# Patient Record
Sex: Female | Born: 1965
Health system: Southern US, Community
[De-identification: ages and names within clinical notes are randomized; demographics above are authoritative.]

## PROBLEM LIST (undated history)

## (undated) DIAGNOSIS — N611 Abscess of the breast and nipple: Secondary | ICD-10-CM

## (undated) DIAGNOSIS — F419 Anxiety disorder, unspecified: Secondary | ICD-10-CM

## (undated) DIAGNOSIS — M1990 Unspecified osteoarthritis, unspecified site: Secondary | ICD-10-CM

## (undated) DIAGNOSIS — E785 Hyperlipidemia, unspecified: Secondary | ICD-10-CM

## (undated) DIAGNOSIS — I10 Essential (primary) hypertension: Secondary | ICD-10-CM

## (undated) HISTORY — PX: TUBAL LIGATION: SHX77

## (undated) HISTORY — PX: OTHER SURGICAL HISTORY: SHX169

## (undated) HISTORY — PX: BREAST EXCISIONAL BIOPSY: SUR124

---

## 2003-08-26 ENCOUNTER — Encounter (INDEPENDENT_AMBULATORY_CARE_PROVIDER_SITE_OTHER): Payer: Self-pay | Admitting: Nurse Practitioner

## 2003-08-26 LAB — CONVERTED CEMR LAB

## 2005-07-23 ENCOUNTER — Emergency Department (HOSPITAL_COMMUNITY): Admission: EM | Admit: 2005-07-23 | Discharge: 2005-07-23 | Payer: Self-pay | Admitting: Emergency Medicine

## 2005-07-26 ENCOUNTER — Emergency Department (HOSPITAL_COMMUNITY): Admission: EM | Admit: 2005-07-26 | Discharge: 2005-07-26 | Payer: Self-pay | Admitting: Emergency Medicine

## 2006-05-20 ENCOUNTER — Emergency Department (HOSPITAL_COMMUNITY): Admission: EM | Admit: 2006-05-20 | Discharge: 2006-05-20 | Payer: Self-pay | Admitting: Emergency Medicine

## 2006-08-13 ENCOUNTER — Emergency Department (HOSPITAL_COMMUNITY): Admission: EM | Admit: 2006-08-13 | Discharge: 2006-08-13 | Payer: Self-pay | Admitting: Family Medicine

## 2006-09-06 ENCOUNTER — Emergency Department (HOSPITAL_COMMUNITY): Admission: EM | Admit: 2006-09-06 | Discharge: 2006-09-06 | Payer: Self-pay | Admitting: Emergency Medicine

## 2006-09-28 ENCOUNTER — Ambulatory Visit: Payer: Self-pay | Admitting: Internal Medicine

## 2007-01-10 ENCOUNTER — Emergency Department (HOSPITAL_COMMUNITY): Admission: EM | Admit: 2007-01-10 | Discharge: 2007-01-10 | Payer: Self-pay | Admitting: Family Medicine

## 2007-04-14 ENCOUNTER — Ambulatory Visit: Payer: Self-pay | Admitting: Internal Medicine

## 2007-04-15 ENCOUNTER — Encounter: Payer: Self-pay | Admitting: Nurse Practitioner

## 2007-04-15 ENCOUNTER — Ambulatory Visit: Payer: Self-pay | Admitting: *Deleted

## 2007-04-15 DIAGNOSIS — E7800 Pure hypercholesterolemia, unspecified: Secondary | ICD-10-CM

## 2007-04-15 DIAGNOSIS — I10 Essential (primary) hypertension: Secondary | ICD-10-CM | POA: Insufficient documentation

## 2007-04-15 DIAGNOSIS — G43909 Migraine, unspecified, not intractable, without status migrainosus: Secondary | ICD-10-CM | POA: Insufficient documentation

## 2007-04-16 ENCOUNTER — Encounter (INDEPENDENT_AMBULATORY_CARE_PROVIDER_SITE_OTHER): Payer: Self-pay | Admitting: Internal Medicine

## 2007-05-12 ENCOUNTER — Encounter (INDEPENDENT_AMBULATORY_CARE_PROVIDER_SITE_OTHER): Payer: Self-pay | Admitting: *Deleted

## 2007-07-01 LAB — CONVERTED CEMR LAB: OCCULT 1: NEGATIVE

## 2007-07-02 ENCOUNTER — Ambulatory Visit: Payer: Self-pay | Admitting: Nurse Practitioner

## 2007-07-02 DIAGNOSIS — F341 Dysthymic disorder: Secondary | ICD-10-CM

## 2007-07-02 LAB — CONVERTED CEMR LAB
KOH Prep: NEGATIVE
Pap Smear: NORMAL

## 2007-07-05 ENCOUNTER — Encounter (INDEPENDENT_AMBULATORY_CARE_PROVIDER_SITE_OTHER): Payer: Self-pay | Admitting: Nurse Practitioner

## 2007-07-05 LAB — CONVERTED CEMR LAB
ALT: 18 U/L
AST: 16 U/L
Albumin: 4.7 g/dL
Alkaline Phosphatase: 90 U/L
BUN: 12 mg/dL
Basophils Absolute: 0 10*3/uL
Basophils Relative: 0 %
CO2: 28 meq/L
Calcium: 10 mg/dL
Chlamydia, DNA Probe: NEGATIVE
Chloride: 96 meq/L
Cholesterol: 225 mg/dL — ABNORMAL HIGH
Creatinine, Ser: 0.78 mg/dL
Eosinophils Absolute: 0.2 10*3/uL
Eosinophils Relative: 2 %
GC Probe Amp, Genital: NEGATIVE
Glucose, Bld: 85 mg/dL
HCT: 47.5 % — ABNORMAL HIGH
HDL: 51 mg/dL
Hemoglobin: 16.3 g/dL — ABNORMAL HIGH
LDL Cholesterol: 146 mg/dL — ABNORMAL HIGH
Lymphocytes Relative: 24 %
Lymphs Abs: 2.1 10*3/uL
MCHC: 34.3 g/dL
MCV: 95 fL
Monocytes Absolute: 0.5 10*3/uL
Monocytes Relative: 6 %
Neutro Abs: 5.9 10*3/uL
Neutrophils Relative %: 68 %
Platelets: 326 10*3/uL
Potassium: 3.8 meq/L
RBC: 5 M/uL
RDW: 13.6 %
Sodium: 140 meq/L
TSH: 1.211 u[IU]/mL
Total Bilirubin: 0.5 mg/dL
Total CHOL/HDL Ratio: 4.4
Total Protein: 7.5 g/dL
Triglycerides: 140 mg/dL
VLDL: 28 mg/dL
WBC: 8.7 10*3/uL

## 2007-10-12 ENCOUNTER — Emergency Department (HOSPITAL_COMMUNITY): Admission: EM | Admit: 2007-10-12 | Discharge: 2007-10-12 | Payer: Self-pay | Admitting: Family Medicine

## 2007-10-13 ENCOUNTER — Telehealth (INDEPENDENT_AMBULATORY_CARE_PROVIDER_SITE_OTHER): Payer: Self-pay | Admitting: Nurse Practitioner

## 2007-12-17 ENCOUNTER — Telehealth (INDEPENDENT_AMBULATORY_CARE_PROVIDER_SITE_OTHER): Payer: Self-pay | Admitting: *Deleted

## 2008-04-27 ENCOUNTER — Telehealth (INDEPENDENT_AMBULATORY_CARE_PROVIDER_SITE_OTHER): Payer: Self-pay | Admitting: *Deleted

## 2008-05-03 ENCOUNTER — Encounter (INDEPENDENT_AMBULATORY_CARE_PROVIDER_SITE_OTHER): Payer: Self-pay | Admitting: *Deleted

## 2008-06-16 ENCOUNTER — Encounter (INDEPENDENT_AMBULATORY_CARE_PROVIDER_SITE_OTHER): Payer: Self-pay | Admitting: *Deleted

## 2008-09-18 ENCOUNTER — Ambulatory Visit: Payer: Self-pay | Admitting: Nurse Practitioner

## 2008-09-18 DIAGNOSIS — R109 Unspecified abdominal pain: Secondary | ICD-10-CM | POA: Insufficient documentation

## 2008-09-18 LAB — CONVERTED CEMR LAB
Beta hcg, urine, semiquantitative: NEGATIVE
Bilirubin Urine: NEGATIVE
Blood in Urine, dipstick: NEGATIVE
Glucose, Urine, Semiquant: NEGATIVE
Ketones, urine, test strip: NEGATIVE
Nitrite: NEGATIVE
Protein, U semiquant: NEGATIVE
Specific Gravity, Urine: 1.02
Urobilinogen, UA: 0.2
WBC Urine, dipstick: NEGATIVE
pH: 6

## 2008-09-19 ENCOUNTER — Encounter (INDEPENDENT_AMBULATORY_CARE_PROVIDER_SITE_OTHER): Payer: Self-pay | Admitting: Nurse Practitioner

## 2008-09-19 LAB — CONVERTED CEMR LAB
ALT: 17 units/L (ref 0–35)
AST: 15 units/L (ref 0–37)
Albumin: 4.2 g/dL (ref 3.5–5.2)
Alkaline Phosphatase: 80 units/L (ref 39–117)
BUN: 10 mg/dL (ref 6–23)
Basophils Absolute: 0 10*3/uL (ref 0.0–0.1)
Basophils Relative: 0 % (ref 0–1)
CO2: 23 meq/L (ref 19–32)
Calcium: 9.3 mg/dL (ref 8.4–10.5)
Chloride: 99 meq/L (ref 96–112)
Creatinine, Ser: 0.72 mg/dL (ref 0.40–1.20)
Eosinophils Absolute: 0.2 10*3/uL (ref 0.0–0.7)
Eosinophils Relative: 2 % (ref 0–5)
Glucose, Bld: 76 mg/dL (ref 70–99)
HCT: 45.5 % (ref 36.0–46.0)
Hemoglobin: 15.5 g/dL — ABNORMAL HIGH (ref 12.0–15.0)
Lymphocytes Relative: 27 % (ref 12–46)
Lymphs Abs: 2.9 10*3/uL (ref 0.7–4.0)
MCHC: 34.1 g/dL (ref 30.0–36.0)
MCV: 94.4 fL (ref 78.0–100.0)
Monocytes Absolute: 0.5 10*3/uL (ref 0.1–1.0)
Monocytes Relative: 5 % (ref 3–12)
Neutro Abs: 7.1 10*3/uL (ref 1.7–7.7)
Neutrophils Relative %: 66 % (ref 43–77)
Platelets: 392 10*3/uL (ref 150–400)
Potassium: 4.2 meq/L (ref 3.5–5.3)
RBC: 4.82 M/uL (ref 3.87–5.11)
RDW: 14 % (ref 11.5–15.5)
Sodium: 139 meq/L (ref 135–145)
TSH: 1.494 microintl units/mL (ref 0.350–4.50)
Total Bilirubin: 0.3 mg/dL (ref 0.3–1.2)
Total Protein: 6.9 g/dL (ref 6.0–8.3)
WBC: 10.7 10*3/uL — ABNORMAL HIGH (ref 4.0–10.5)

## 2008-10-06 ENCOUNTER — Telehealth (INDEPENDENT_AMBULATORY_CARE_PROVIDER_SITE_OTHER): Payer: Self-pay | Admitting: Nurse Practitioner

## 2009-02-05 ENCOUNTER — Telehealth (INDEPENDENT_AMBULATORY_CARE_PROVIDER_SITE_OTHER): Payer: Self-pay | Admitting: Nurse Practitioner

## 2009-02-22 ENCOUNTER — Ambulatory Visit: Payer: Self-pay | Admitting: Nurse Practitioner

## 2009-02-22 DIAGNOSIS — B009 Herpesviral infection, unspecified: Secondary | ICD-10-CM | POA: Insufficient documentation

## 2009-02-22 DIAGNOSIS — M545 Low back pain, unspecified: Secondary | ICD-10-CM | POA: Insufficient documentation

## 2009-02-22 DIAGNOSIS — F17200 Nicotine dependence, unspecified, uncomplicated: Secondary | ICD-10-CM

## 2009-02-22 LAB — CONVERTED CEMR LAB
Cholesterol, target level: 200 mg/dL
HDL goal, serum: 40 mg/dL
LDL Goal: 130 mg/dL

## 2009-02-27 ENCOUNTER — Encounter (INDEPENDENT_AMBULATORY_CARE_PROVIDER_SITE_OTHER): Payer: Self-pay | Admitting: Nurse Practitioner

## 2009-02-27 LAB — CONVERTED CEMR LAB
ALT: 14 units/L (ref 0–35)
AST: 15 units/L (ref 0–37)
Albumin: 4.5 g/dL (ref 3.5–5.2)
Alkaline Phosphatase: 77 units/L (ref 39–117)
BUN: 6 mg/dL (ref 6–23)
Basophils Absolute: 0 10*3/uL (ref 0.0–0.1)
Basophils Relative: 0 % (ref 0–1)
CO2: 28 meq/L (ref 19–32)
Calcium: 9.1 mg/dL (ref 8.4–10.5)
Chloride: 99 meq/L (ref 96–112)
Cholesterol: 288 mg/dL — ABNORMAL HIGH (ref 0–200)
Creatinine, Ser: 0.69 mg/dL (ref 0.40–1.20)
Eosinophils Absolute: 0.2 10*3/uL (ref 0.0–0.7)
Eosinophils Relative: 2 % (ref 0–5)
Glucose, Bld: 89 mg/dL (ref 70–99)
HCT: 47.3 % — ABNORMAL HIGH (ref 36.0–46.0)
HDL: 39 mg/dL — ABNORMAL LOW (ref >39)
Hemoglobin: 15.9 g/dL — ABNORMAL HIGH (ref 12.0–15.0)
LDL Cholesterol: 188 mg/dL — ABNORMAL HIGH (ref 0–99)
Lymphocytes Relative: 29 % (ref 12–46)
Lymphs Abs: 2.4 10*3/uL (ref 0.7–4.0)
MCHC: 33.6 g/dL (ref 30.0–36.0)
MCV: 95.7 fL (ref 78.0–100.0)
Monocytes Absolute: 0.4 10*3/uL (ref 0.1–1.0)
Monocytes Relative: 5 % (ref 3–12)
Neutro Abs: 5.1 10*3/uL (ref 1.7–7.7)
Neutrophils Relative %: 64 % (ref 43–77)
Platelets: 296 10*3/uL (ref 150–400)
Potassium: 4.1 meq/L (ref 3.5–5.3)
RBC: 4.94 M/uL (ref 3.87–5.11)
RDW: 14.5 % (ref 11.5–15.5)
Sodium: 138 meq/L (ref 135–145)
Total Bilirubin: 0.4 mg/dL (ref 0.3–1.2)
Total CHOL/HDL Ratio: 7.4
Total Protein: 6.9 g/dL (ref 6.0–8.3)
Triglycerides: 305 mg/dL — ABNORMAL HIGH (ref <150)
VLDL: 61 mg/dL — ABNORMAL HIGH (ref 0–40)
WBC: 8.1 10*3/uL (ref 4.0–10.5)

## 2009-04-12 ENCOUNTER — Emergency Department (HOSPITAL_COMMUNITY): Admission: EM | Admit: 2009-04-12 | Discharge: 2009-04-12 | Payer: Self-pay | Admitting: Emergency Medicine

## 2009-09-27 ENCOUNTER — Emergency Department (HOSPITAL_COMMUNITY): Admission: EM | Admit: 2009-09-27 | Discharge: 2009-09-27 | Payer: Self-pay | Admitting: Family Medicine

## 2010-03-11 ENCOUNTER — Emergency Department (HOSPITAL_COMMUNITY): Admission: EM | Admit: 2010-03-11 | Discharge: 2010-03-11 | Payer: Self-pay | Admitting: Emergency Medicine

## 2010-09-15 ENCOUNTER — Encounter: Payer: Self-pay | Admitting: Internal Medicine

## 2011-01-18 ENCOUNTER — Emergency Department (HOSPITAL_COMMUNITY)
Admission: EM | Admit: 2011-01-18 | Discharge: 2011-01-18 | Disposition: A | Discharge status: Home / Self Care | Payer: Self-pay | Attending: Emergency Medicine | Admitting: Emergency Medicine

## 2011-01-18 DIAGNOSIS — E669 Obesity, unspecified: Secondary | ICD-10-CM | POA: Insufficient documentation

## 2011-01-18 DIAGNOSIS — F17200 Nicotine dependence, unspecified, uncomplicated: Secondary | ICD-10-CM | POA: Insufficient documentation

## 2011-01-18 DIAGNOSIS — K029 Dental caries, unspecified: Secondary | ICD-10-CM | POA: Insufficient documentation

## 2011-01-18 DIAGNOSIS — I10 Essential (primary) hypertension: Secondary | ICD-10-CM | POA: Insufficient documentation

## 2011-01-18 DIAGNOSIS — E7800 Pure hypercholesterolemia, unspecified: Secondary | ICD-10-CM | POA: Insufficient documentation

## 2011-01-18 DIAGNOSIS — K089 Disorder of teeth and supporting structures, unspecified: Secondary | ICD-10-CM | POA: Insufficient documentation

## 2011-01-18 DIAGNOSIS — F341 Dysthymic disorder: Secondary | ICD-10-CM | POA: Insufficient documentation

## 2011-02-27 ENCOUNTER — Emergency Department (HOSPITAL_COMMUNITY)
Admission: EM | Admit: 2011-02-27 | Discharge: 2011-02-27 | Disposition: A | Discharge status: Home / Self Care | Payer: Self-pay | Attending: Emergency Medicine | Admitting: Emergency Medicine

## 2011-02-27 DIAGNOSIS — E7800 Pure hypercholesterolemia, unspecified: Secondary | ICD-10-CM | POA: Insufficient documentation

## 2011-02-27 DIAGNOSIS — I10 Essential (primary) hypertension: Secondary | ICD-10-CM | POA: Insufficient documentation

## 2011-02-27 DIAGNOSIS — J029 Acute pharyngitis, unspecified: Secondary | ICD-10-CM | POA: Insufficient documentation

## 2011-02-27 DIAGNOSIS — R112 Nausea with vomiting, unspecified: Secondary | ICD-10-CM | POA: Insufficient documentation

## 2011-02-27 DIAGNOSIS — IMO0001 Reserved for inherently not codable concepts without codable children: Secondary | ICD-10-CM | POA: Insufficient documentation

## 2011-02-27 DIAGNOSIS — R05 Cough: Secondary | ICD-10-CM | POA: Insufficient documentation

## 2011-02-27 DIAGNOSIS — J069 Acute upper respiratory infection, unspecified: Secondary | ICD-10-CM | POA: Insufficient documentation

## 2011-02-27 DIAGNOSIS — R059 Cough, unspecified: Secondary | ICD-10-CM | POA: Insufficient documentation

## 2011-02-27 DIAGNOSIS — J3489 Other specified disorders of nose and nasal sinuses: Secondary | ICD-10-CM | POA: Insufficient documentation

## 2011-02-27 LAB — RAPID STREP SCREEN (MED CTR MEBANE ONLY): Streptococcus, Group A Screen (Direct): NEGATIVE

## 2011-06-02 ENCOUNTER — Emergency Department (HOSPITAL_COMMUNITY): Payer: Self-pay

## 2011-06-02 ENCOUNTER — Emergency Department (HOSPITAL_COMMUNITY)
Admission: EM | Admit: 2011-06-02 | Discharge: 2011-06-02 | Disposition: A | Discharge status: Home / Self Care | Payer: Self-pay | Attending: Emergency Medicine | Admitting: Emergency Medicine

## 2011-06-02 DIAGNOSIS — R072 Precordial pain: Secondary | ICD-10-CM | POA: Insufficient documentation

## 2011-06-02 DIAGNOSIS — E7800 Pure hypercholesterolemia, unspecified: Secondary | ICD-10-CM | POA: Insufficient documentation

## 2011-06-02 DIAGNOSIS — M79609 Pain in unspecified limb: Secondary | ICD-10-CM | POA: Insufficient documentation

## 2011-06-02 DIAGNOSIS — I10 Essential (primary) hypertension: Secondary | ICD-10-CM | POA: Insufficient documentation

## 2011-06-02 LAB — COMPREHENSIVE METABOLIC PANEL
ALT: 11 U/L (ref 0–35)
AST: 15 U/L (ref 0–37)
Albumin: 4.1 g/dL (ref 3.5–5.2)
Alkaline Phosphatase: 94 U/L (ref 39–117)
BUN: 7 mg/dL (ref 6–23)
CO2: 30 mEq/L (ref 19–32)
Calcium: 9.6 mg/dL (ref 8.4–10.5)
Chloride: 100 mEq/L (ref 96–112)
Creatinine, Ser: 0.69 mg/dL (ref 0.50–1.10)
GFR calc Af Amer: >90 mL/min (ref >90)
GFR calc non Af Amer: >90 mL/min (ref >90)
Glucose, Bld: 91 mg/dL (ref 70–99)
Potassium: 3.9 mEq/L (ref 3.5–5.1)
Total Protein: 7.1 g/dL (ref 6.0–8.3)

## 2011-06-02 LAB — CBC
HCT: 46.8 % — ABNORMAL HIGH (ref 36.0–46.0)
Hemoglobin: 16.6 g/dL — ABNORMAL HIGH (ref 12.0–15.0)
MCHC: 35.5 g/dL (ref 30.0–36.0)
MCV: 92.5 fL (ref 78.0–100.0)
Platelets: 302 10*3/uL (ref 150–400)
RBC: 5.06 MIL/uL (ref 3.87–5.11)
RDW: 13.4 % (ref 11.5–15.5)
WBC: 7.7 10*3/uL (ref 4.0–10.5)

## 2011-06-02 LAB — POCT I-STAT TROPONIN I: Troponin i, poc: 0 ng/mL (ref 0.00–0.08)

## 2011-09-16 ENCOUNTER — Emergency Department (HOSPITAL_COMMUNITY)
Admission: EM | Admit: 2011-09-16 | Discharge: 2011-09-16 | Disposition: A | Discharge status: Home / Self Care | Payer: Self-pay | Attending: Emergency Medicine | Admitting: Emergency Medicine

## 2011-09-16 ENCOUNTER — Encounter (HOSPITAL_COMMUNITY): Payer: Self-pay | Admitting: *Deleted

## 2011-09-16 DIAGNOSIS — F17200 Nicotine dependence, unspecified, uncomplicated: Secondary | ICD-10-CM | POA: Insufficient documentation

## 2011-09-16 DIAGNOSIS — T6391XA Toxic effect of contact with unspecified venomous animal, accidental (unintentional), initial encounter: Secondary | ICD-10-CM | POA: Insufficient documentation

## 2011-09-16 DIAGNOSIS — T63301A Toxic effect of unspecified spider venom, accidental (unintentional), initial encounter: Secondary | ICD-10-CM

## 2011-09-16 DIAGNOSIS — T63391A Toxic effect of venom of other spider, accidental (unintentional), initial encounter: Secondary | ICD-10-CM | POA: Insufficient documentation

## 2011-09-16 HISTORY — DX: Essential (primary) hypertension: I10

## 2011-09-16 HISTORY — DX: Hyperlipidemia, unspecified: E78.5

## 2011-09-16 MED ORDER — HYDROCODONE-ACETAMINOPHEN 5-325 MG PO TABS
1.0000 | ORAL_TABLET | Freq: Once | ORAL | Status: AC
  Administered 2011-09-16: 1 via ORAL
  Filled 2011-09-16: qty 1

## 2011-09-16 NOTE — ED Notes (Signed)
Pt from home c/o swollen, red area to upper right back, reports that while she was at work yesterday, she felt "a pinch" and then saw a small, dark colored spider fall to the floor.

## 2011-09-16 NOTE — ED Provider Notes (Signed)
History     CSN: [id number]  Arrival date & time 09/16/11  1306   First MD Initiated Contact with Patient 09/16/11 1329      Chief Complaint  Patient presents with  . Insect Bite    (Consider location/radiation/quality/duration/timing/severity/associated sxs/prior treatment) HPI Comments: Patient reports that yesterday afternoon she was bitten by a spider on her back.  She states that she saw the spider that bit her.  She reports that the spider did not look unusual.  She has noticed some tenderness, swelling, and erythema to the area where she was bitten.  She denies fever/chills, or any other symptoms.  The history is provided by the patient.    Past Medical History  Diagnosis Date  . Hypertension   . Hyperlipidemia     Past Surgical History  Procedure Date  . Cyst removed from l breast   . Surgery on l wrist as a child     Family History  Problem Relation Age of Onset  . Diabetes Mother   . Cancer Mother   . Heart failure Father   . Hypertension Father     History  Substance Use Topics  . Smoking status: Current Everyday Smoker -- 0.5 packs/day    Types: Cigarettes  . Smokeless tobacco: Never Used  . Alcohol Use: No    OB History    Grav Para Term Preterm Abortions TAB SAB Ect Mult Living                  Review of Systems  Constitutional: Negative for fever and chills.  Respiratory: Negative for shortness of breath.   Gastrointestinal: Negative for nausea and vomiting.  Neurological: Negative for dizziness, syncope, light-headedness and headaches.    Allergies  Review of patient's allergies indicates no known allergies.  Home Medications  No current outpatient prescriptions on file.  BP 157/89  Pulse 76  Temp(Src) 98.3 F (36.8 C) (Oral)  Resp 16  Wt 190 lb 14.7 oz (86.6 kg)  SpO2 100%  LMP 08/27/2011  Physical Exam  Nursing note and vitals reviewed. Constitutional: She is oriented to person, place, and time. She appears well-developed  and well-nourished.  HENT:  Head: Normocephalic and atraumatic.  Neck: Normal range of motion. Neck supple.  Cardiovascular: Normal rate, regular rhythm and normal heart sounds.   Pulmonary/Chest: Effort normal and breath sounds normal. No respiratory distress. She has no wheezes.  Musculoskeletal: Normal range of motion.  Neurological: She is alert and oriented to person, place, and time.  Skin: Skin is warm and dry.     Psychiatric: She has a normal mood and affect.    ED Course  Procedures (including critical care time)  Labs Reviewed - No data to display No results found.   No diagnosis found.    MDM  Patient complaining of spider bite.  Patient reports that she actually felt the spider bite her and saw the spider.  Patient instructed to take Benadryl if itching presents.  Patient also given instructions to monitor the area and to return if necrosis presents or if the area becomes more erythematous or she develops systemic symptoms.        Pascal Lux End[place]ott, PA-C 09/16/11 1640

## 2011-09-18 NOTE — ED Provider Notes (Signed)
Medical screening examination/treatment/procedure(s) were performed by non-physician practitioner and as supervising physician I was immediately available for consultation/collaboration.   Forbes Cellar, MD 09/18/11 1331

## 2011-09-19 ENCOUNTER — Emergency Department (HOSPITAL_COMMUNITY)
Admission: EM | Admit: 2011-09-19 | Discharge: 2011-09-19 | Disposition: A | Discharge status: Home / Self Care | Payer: Self-pay | Attending: Emergency Medicine | Admitting: Emergency Medicine

## 2011-09-19 ENCOUNTER — Encounter (HOSPITAL_COMMUNITY): Payer: Self-pay

## 2011-09-19 DIAGNOSIS — L02219 Cutaneous abscess of trunk, unspecified: Secondary | ICD-10-CM | POA: Insufficient documentation

## 2011-09-19 DIAGNOSIS — F17200 Nicotine dependence, unspecified, uncomplicated: Secondary | ICD-10-CM | POA: Insufficient documentation

## 2011-09-19 DIAGNOSIS — L0291 Cutaneous abscess, unspecified: Secondary | ICD-10-CM

## 2011-09-19 DIAGNOSIS — L03319 Cellulitis of trunk, unspecified: Secondary | ICD-10-CM | POA: Insufficient documentation

## 2011-09-19 MED ORDER — OXYCODONE-ACETAMINOPHEN 5-325 MG PO TABS
2.0000 | ORAL_TABLET | Freq: Once | ORAL | Status: AC
  Administered 2011-09-19: 2 via ORAL
  Filled 2011-09-19 (×2): qty 1

## 2011-09-19 MED ORDER — ONDANSETRON HCL 4 MG PO TABS: 4.0000 mg | ORAL_TABLET | Freq: Four times a day (QID) | ORAL | Status: AC

## 2011-09-19 MED ORDER — DOXYCYCLINE HYCLATE 100 MG PO CAPS: 100.0000 mg | ORAL_CAPSULE | Freq: Two times a day (BID) | ORAL | Status: AC

## 2011-09-19 MED ORDER — OXYCODONE-ACETAMINOPHEN 5-325 MG PO TABS
2.0000 | ORAL_TABLET | ORAL | Status: AC | PRN
Start: 2011-09-19 — End: 2011-09-29

## 2011-09-19 MED ORDER — ONDANSETRON 4 MG PO TBDP
8.0000 mg | ORAL_TABLET | Freq: Once | ORAL | Status: AC
  Administered 2011-09-19: 8 mg via ORAL
  Filled 2011-09-19: qty 1

## 2011-09-19 NOTE — ED Notes (Signed)
Pt reports she was dx w/spider bite at [facility] ED Tuesday, pt reports a white surface came up and ruptured yesterday, woke this am w/2 more areas, pt c/o fever x2 days, reports taking Ibuprofen w/no relief.

## 2011-09-19 NOTE — ED Notes (Signed)
abscess on back under bra, very painful

## 2011-09-19 NOTE — ED Provider Notes (Signed)
History     CSN: [id number]  Arrival date & time 09/19/11  0920   None     Chief Complaint  Patient presents with  . Abscess    (Consider location/radiation/quality/duration/timing/severity/associated sxs/prior treatment) Patient is a 46 y.o. female presenting with abscess. The history is provided by the patient. No language interpreter was used.  Abscess  This is a new problem. The current episode started less than one week ago. The problem has been unchanged. Affected Location: R mid back. The problem is moderate. The abscess is characterized by redness, painfulness and draining. It is unknown what she was exposed to. The abscess first occurred at home. Pertinent negatives include no fever and no vomiting. Recently, medical care has been given at this facility.   46 year old female coming in today with a abscess to her right upper back approximately 6 cm from round. Went to [facility] 2 days ago and the pain and inflammation has worsened. She is refusing incision and drainage at this time. The inflammation area has decreased by approximately 3 cm since she was at [facility]. Pain is 9/10.  Past Medical History  Diagnosis Date  . Hypertension   . Hyperlipidemia     Past Surgical History  Procedure Date  . Cyst removed from l breast   . Surgery on l wrist as a child     Family History  Problem Relation Age of Onset  . Diabetes Mother   . Cancer Mother   . Heart failure Father   . Hypertension Father     History  Substance Use Topics  . Smoking status: Current Everyday Smoker -- 0.5 packs/day    Types: Cigarettes  . Smokeless tobacco: Never Used  . Alcohol Use: No    OB History    Grav Para Term Preterm Abortions TAB SAB Ect Mult Living                  Review of Systems  Constitutional: Negative for fever.  Gastrointestinal: Negative for vomiting.    Allergies  Review of patient's allergies indicates no known allergies.  Home Medications  No current  outpatient prescriptions on file.  BP 172/91  Pulse 84  Temp(Src) 98.3 F (36.8 C) (Oral)  Resp 22  SpO2 100%  LMP 08/27/2011  Physical Exam  Nursing note and vitals reviewed. Constitutional: She is oriented to person, place, and time. She appears well-developed and well-nourished.  HENT:  Head: Normocephalic and atraumatic.  Eyes: Conjunctivae and EOM are normal. Pupils are equal, round, and reactive to light.  Neck: Normal range of motion. Neck supple.  Cardiovascular: Normal rate and regular rhythm.   Pulmonary/Chest: Effort normal.  Abdominal: Soft. Bowel sounds are normal.  Musculoskeletal: Normal range of motion. She exhibits no edema and no tenderness.  Neurological: She is alert and oriented to person, place, and time. She has normal reflexes.  Skin: Skin is warm and dry.       Abscess to R upper back 6cm  Psychiatric: She has a normal mood and affect.    ED Course  Procedures (including critical care time)  Labs Reviewed - No data to display No results found.   No diagnosis found.    MDM  Returns to ER with infected ? Spider bite from 4 days ago.  Area of erythema has decreased but draining abscess presently.  Refused I & D.  Placed on doxycycline and percocet for pain.  [facility] or [facility] if worse.  Area of erythema outlined with marker.  Instructed to use hot compresses and squeeze tid.         Jethro Bastos, NP 09/20/11 1139  Jethro Bastos, NP 09/20/11 1140

## 2011-09-21 NOTE — ED Provider Notes (Signed)
Medical screening examination/treatment/procedure(s) were performed by non-physician practitioner and as supervising physician I was immediately available for consultation/collaboration.   Aneya Daddona, MD 09/21/11 1808 

## 2012-01-06 ENCOUNTER — Encounter (HOSPITAL_COMMUNITY): Payer: Self-pay | Admitting: *Deleted

## 2012-01-06 ENCOUNTER — Emergency Department (HOSPITAL_COMMUNITY)
Admission: EM | Admit: 2012-01-06 | Discharge: 2012-01-06 | Disposition: A | Discharge status: Home / Self Care | Payer: Self-pay | Attending: Emergency Medicine | Admitting: Emergency Medicine

## 2012-01-06 DIAGNOSIS — L237 Allergic contact dermatitis due to plants, except food: Secondary | ICD-10-CM

## 2012-01-06 DIAGNOSIS — L255 Unspecified contact dermatitis due to plants, except food: Secondary | ICD-10-CM | POA: Insufficient documentation

## 2012-01-06 MED ORDER — PREDNISONE 20 MG PO TABS: ORAL_TABLET | ORAL | Status: AC

## 2012-01-06 MED ORDER — PREDNISONE 20 MG PO TABS
60.0000 mg | ORAL_TABLET | Freq: Once | ORAL | Status: AC
  Administered 2012-01-06: 60 mg via ORAL
  Filled 2012-01-06: qty 3

## 2012-01-06 NOTE — ED Notes (Signed)
Pt. With what she thinks is poison ivy on her face, right arm, neck.  Pt. And sig. Other were working in the yard Sat., and rash appeared Sun. Morning.

## 2012-01-06 NOTE — ED Notes (Signed)
pts states she was in poison oak the other day and now has a rash

## 2012-01-06 NOTE — Discharge Instructions (Signed)
Poison Oak Poison oak is an inflammation of the skin (contact dermatitis). It is caused by contact with the allergens on the leaves of the oak (toxicodendron) plants. Depending on your sensitivity, the rash may consist simply of redness and itching, or it may also progress to blisters which may break open (rupture). These must be well cared for to prevent secondary germ (bacterial) infection as these infections can lead to scarring. The eyes may also get puffy. The puffiness is worst in the morning and gets better as the day progresses. Healing is best accomplished by keeping any open areas dry, clean, covered with a bandage, and covered with an antibacterial ointment if needed. Without secondary infection, this dermatitis usually heals without scarring within 2 to 3 weeks without treatment. HOME CARE INSTRUCTIONS When you have been exposed to poison oak, it is very important to thoroughly wash with soap and water as soon as the exposure has been discovered. You have about one half hour to remove the plant resin before it will cause the rash. This cleaning will quickly destroy the oil or antigen on the skin (the antigen is what causes the rash). Wash aggressively under the fingernails as any plant resin still there will continue to spread the rash. Do not rub skin vigorously when washing affected area. Poison oak cannot spread if no oil from the plant remains on your body. Rash that has progressed to weeping sores (lesions) will not spread the rash unless you have not washed thoroughly. It is also important to clean any clothes you have been wearing as they may carry active allergens which will spread the rash, even several days later. Avoidance of the plant in the future is the best measure. Poison oak plants can be recognized by the number of leaves. Generally, poison oak has three leaves with flowering branches on a single stem. Diphenhydramine may be purchased over the counter and used as needed for  itching. Do not drive with this medication if it makes you drowsy. Ask your caregiver about medication for children. SEEK IMMEDIATE MEDICAL CARE IF:   Open areas of the rash develop.   You notice redness extending beyond the area of the rash.   There is a pus like discharge.   There is increased pain.   Other signs of infection develop (such as fever).  Document Released: 02/15/2003 Document Revised: 07/31/2011 Document Reviewed: 06/27/2009 [employer] Patient Information 2012 [employer]. 

## 2012-01-06 NOTE — ED Provider Notes (Signed)
History     CSN: [id number]  Arrival date & time 01/06/12  2046   First MD Initiated Contact with Patient 01/06/12 2216      Chief Complaint  Patient presents with  . Rash    (Consider location/radiation/quality/duration/timing/severity/associated sxs/prior treatment) HPI  46 year old female presents complaining of rash. Her husband was doing yard work 3 days ago and after exposure to poison oak he accidentally wipes his hand on her face.  Pt subsequently notice an itchy rash to her L face where he touches her.  She also notice same itchy rash to her R forearm.  Rash has been gradual, and persistent.  Denies pain, denies fever, chills, sore throat, throat swelling, cp, sob, n/v/d, abd pain. Denies new pets, or medication changes.  Husband does not have reaction to poison oak. Pt has tried calamine lotion and benadryl with minimal relief.  Denies rash on palms of hand or soles of feet.    Past Medical History  Diagnosis Date  . Hypertension   . Hyperlipidemia   . Hypertension     Past Surgical History  Procedure Date  . Cyst removed from l breast   . Surgery on l wrist as a child     Family History  Problem Relation Age of Onset  . Diabetes Mother   . Cancer Mother   . Heart failure Father   . Hypertension Father     History  Substance Use Topics  . Smoking status: Current Everyday Smoker -- 0.5 packs/day    Types: Cigarettes    Last Attempt to Quit: 09/19/2011  . Smokeless tobacco: Never Used  . Alcohol Use: No    OB History    Grav Para Term Preterm Abortions TAB SAB Ect Mult Living                  Review of Systems  All other systems reviewed and are negative.    Allergies  Review of patient's allergies indicates no known allergies.  Home Medications  No current outpatient prescriptions on file.  BP 143/90  Pulse 69  Temp(Src) 99 F (37.2 C) (Oral)  Resp 15  SpO2 99%  LMP 01/06/2012  Physical Exam  Nursing note and vitals  reviewed. Constitutional: She is oriented to person, place, and time. She appears well-developed and well-nourished. No distress.  HENT:  Mouth/Throat: Oropharynx is clear and moist. No oropharyngeal exudate.  Eyes: Conjunctivae and EOM are normal. Pupils are equal, round, and reactive to light.  Neck: Normal range of motion. Neck supple.  Cardiovascular: Normal rate and regular rhythm.   Pulmonary/Chest: Effort normal and breath sounds normal. No respiratory distress. She has no wheezes. She exhibits no tenderness.  Abdominal: Soft. There is no tenderness.  Musculoskeletal: Normal range of motion.  Neurological: She is alert and oriented to person, place, and time.  Skin: Skin is warm. Rash noted.       Red streaky patchy rash to L face, and multiple red bumps noted to R volar aspect of forearm, with excoriation marks noted.  Non pustular, non petechiae  Psychiatric: She has a normal mood and affect.    ED Course  Procedures (including critical care time)  Labs Reviewed - No data to display No results found.   No diagnosis found.    MDM  Rash secondary to exposure to poison oak.  Pt applied calamine lotion to rash so it's difficulty to fully characterized.  Nonpustular, non petechial, and does not follow dermatomal pattern.  Will  treat with prednisone for 3 weeks. Pt to continue benadryl and calamine lotion at  Home.  Pt voice understanding and agrees with plan.  NO respiratory compromise.  Pt has no hx of diabetes        Fayrene Helper, PA-C 01/06/12 2251  Fayrene Helper, PA-C 01/06/12 2258

## 2012-01-07 NOTE — ED Provider Notes (Signed)
Medical screening examination/treatment/procedure(s) were performed by non-physician practitioner and as supervising physician I was immediately available for consultation/collaboration.    Janeice Stegall R Elihue Ebert, MD 01/07/12 1504 

## 2014-02-26 ENCOUNTER — Encounter (HOSPITAL_COMMUNITY): Payer: Self-pay | Admitting: Emergency Medicine

## 2014-02-26 ENCOUNTER — Emergency Department (HOSPITAL_COMMUNITY): Payer: Self-pay

## 2014-02-26 ENCOUNTER — Emergency Department (HOSPITAL_COMMUNITY)
Admission: EM | Admit: 2014-02-26 | Discharge: 2014-02-26 | Disposition: A | Discharge status: Home / Self Care | Payer: Self-pay | Attending: Emergency Medicine | Admitting: Emergency Medicine

## 2014-02-26 DIAGNOSIS — M545 Low back pain, unspecified: Secondary | ICD-10-CM

## 2014-02-26 DIAGNOSIS — Z862 Personal history of diseases of the blood and blood-forming organs and certain disorders involving the immune mechanism: Secondary | ICD-10-CM | POA: Insufficient documentation

## 2014-02-26 DIAGNOSIS — Y9389 Activity, other specified: Secondary | ICD-10-CM | POA: Insufficient documentation

## 2014-02-26 DIAGNOSIS — W19XXXA Unspecified fall, initial encounter: Secondary | ICD-10-CM

## 2014-02-26 DIAGNOSIS — W11XXXA Fall on and from ladder, initial encounter: Secondary | ICD-10-CM | POA: Insufficient documentation

## 2014-02-26 DIAGNOSIS — Y929 Unspecified place or not applicable: Secondary | ICD-10-CM | POA: Insufficient documentation

## 2014-02-26 DIAGNOSIS — S4980XA Other specified injuries of shoulder and upper arm, unspecified arm, initial encounter: Secondary | ICD-10-CM | POA: Insufficient documentation

## 2014-02-26 DIAGNOSIS — I10 Essential (primary) hypertension: Secondary | ICD-10-CM | POA: Insufficient documentation

## 2014-02-26 DIAGNOSIS — M898X1 Other specified disorders of bone, shoulder: Secondary | ICD-10-CM

## 2014-02-26 DIAGNOSIS — M25511 Pain in right shoulder: Secondary | ICD-10-CM

## 2014-02-26 DIAGNOSIS — Z8639 Personal history of other endocrine, nutritional and metabolic disease: Secondary | ICD-10-CM | POA: Insufficient documentation

## 2014-02-26 DIAGNOSIS — IMO0002 Reserved for concepts with insufficient information to code with codable children: Secondary | ICD-10-CM | POA: Insufficient documentation

## 2014-02-26 DIAGNOSIS — F17200 Nicotine dependence, unspecified, uncomplicated: Secondary | ICD-10-CM | POA: Insufficient documentation

## 2014-02-26 DIAGNOSIS — S46909A Unspecified injury of unspecified muscle, fascia and tendon at shoulder and upper arm level, unspecified arm, initial encounter: Secondary | ICD-10-CM | POA: Insufficient documentation

## 2014-02-26 MED ORDER — OXYCODONE-ACETAMINOPHEN 5-325 MG PO TABS
1.0000 | ORAL_TABLET | Freq: Once | ORAL | Status: AC
  Administered 2014-02-26: 1 via ORAL
  Filled 2014-02-26: qty 1

## 2014-02-26 MED ORDER — IBUPROFEN 600 MG PO TABS: 600.0000 mg | ORAL_TABLET | Freq: Four times a day (QID) | ORAL | Status: DC | PRN

## 2014-02-26 MED ORDER — CYCLOBENZAPRINE HCL 5 MG PO TABS: 5.0000 mg | ORAL_TABLET | Freq: Three times a day (TID) | ORAL | Status: DC | PRN

## 2014-02-26 MED ORDER — OXYCODONE-ACETAMINOPHEN 5-325 MG PO TABS: 1.0000 | ORAL_TABLET | Freq: Four times a day (QID) | ORAL | Status: DC | PRN

## 2014-02-26 NOTE — ED Notes (Signed)
Pt alert and oriented x4. Respirations even and unlabored, bilateral symmetrical rise and fall of chest. Skin warm and dry. In no acute distress. Denies needs.   

## 2014-02-26 NOTE — Discharge Instructions (Signed)
Return to the ED with any concerns including difficulty breathing, weakness of legs, not able to urinate, loss of control of bowel or bladder, decreased level of alertness/lethargy, or any other alarming symptoms

## 2014-02-26 NOTE — ED Notes (Signed)
Pt escorted to discharge window. Pt verbalized understanding discharge instructions. In no acute distress.  

## 2014-02-26 NOTE — ED Notes (Addendum)
Pt present with NAD. Pt states she fell 3 days ago. Fell backwards off ladder. (approx 5 feet) landing on back then rolled. Pt c/o of generalized back pain, rt shoulder pain, Denies numbness and tingling. Ambulatory without problems. Neuro intact. Her work requires a MD note for missing work. Pt continues to  Have increased pain. Pt out of BP meds. Has not taken BP meds for 5 months. Need PCP and meds

## 2014-02-26 NOTE — ED Provider Notes (Signed)
CSN: [id number]     Arrival date & time 02/26/14  1506 History   First MD Initiated Contact with Patient 02/26/14 1547     Chief Complaint  Patient presents with  . Fall  . Back Pain  . Shoulder Pain     (Consider location/radiation/quality/duration/timing/severity/associated sxs/prior Treatment) HPI Pt presents with c/o pain in right shoulder blade, shoulder and low back after fall from ladder 3 days ago.  Pain is constant, described as soreness and worse with movement and palpation.  Denies striking her head,no LOC.  Pain has been worsening after the fall.  Able to ambulate without difficulty.  No difficulty breathing.  Pt has tried ibuprofen without much relief. There are no other associated systemic symptoms, there are no other alleviating or modifying factors.   Past Medical History  Diagnosis Date  . Hypertension   . Hyperlipidemia   . Hypertension    Past Surgical History  Procedure Laterality Date  . Cyst removed from l breast    . Surgery on l wrist as a child     Family History  Problem Relation Age of Onset  . Diabetes Mother   . Cancer Mother   . Heart failure Father   . Hypertension Father    History  Substance Use Topics  . Smoking status: Current Every Day Smoker -- 0.50 packs/day    Types: Cigarettes    Last Attempt to Quit: 09/19/2011  . Smokeless tobacco: Never Used  . Alcohol Use: No   OB History   Grav Para Term Preterm Abortions TAB SAB Ect Mult Living                 Review of Systems ROS reviewed and all otherwise negative except for mentioned in HPI    Allergies  Review of patient's allergies indicates no known allergies.  Home Medications   Prior to Admission medications   Medication Sig Start Date End Date Taking? Authorizing Provider  ibuprofen (ADVIL,MOTRIN) 200 MG tablet Take 1,200 mg by mouth every 4 (four) hours as needed for moderate pain.   Yes Historical Provider, MD  cyclobenzaprine (FLEXERIL) 5 MG tablet Take 1 tablet (5 mg  total) by mouth 3 (three) times daily as needed for muscle spasms. 02/26/14   Ethelda ChickMartha K Linker, MD  ibuprofen (ADVIL,MOTRIN) 600 MG tablet Take 1 tablet (600 mg total) by mouth every 6 (six) hours as needed. 02/26/14   Ethelda ChickMartha K Linker, MD  oxyCODONE-acetaminophen (PERCOCET/ROXICET) 5-325 MG per tablet Take 1-2 tablets by mouth every 6 (six) hours as needed for severe pain. 02/26/14   Ethelda ChickMartha K Linker, MD   BP 160/77  Pulse 66  Temp(Src) 98 F (36.7 C) (Oral)  Resp 16  Ht 5\' 4"  (1.626 m)  Wt 200 lb (90.719 kg)  BMI 34.31 kg/m2  SpO2 95%  LMP 02/18/2014 Vitals reviewed Physical Exam Physical Examination: General appearance - alert, well appearing, and in no distress Mental status - alert, oriented to person, place, and time Head- NCAT Eyes - no conjunctival injection, no scleral icterus Mouth - mucous membranes moist, pharynx normal without lesions Neck - supple, no significant adenopathy, no midline tenderness to palpation Chest - clear to auscultation, no wheezes, rales or rhonchi, symmetric air entry Heart - normal rate, regular rhythm, normal S1, S2, no murmurs, rubs, clicks or gallops Abdomen - soft, nontender, nondistended, no masses or organomegaly Back exam - full range of motion, mild midline tenderness of lumbar region as well as right sided paraspinous muscles, no  thoracic midline tenderness to palpation, pain worse with change of positions on bed Neurological - alert, oriented, normal speech, strength 5/5 in extremities x 4, sensation intact Musculoskeletal - pain with ROM of right shoulder with ttp over right scapula, otherwise no joint tenderness, deformity or swelling Extremities - peripheral pulses normal, no pedal edema, no clubbing or cyanosis Skin - normal coloration and turgor, no rashes  ED Course  Procedures (including critical care time) Labs Review Labs Reviewed - No data to display  Imaging Review Dg Lumbar Spine Complete  02/26/2014   CLINICAL DATA:  Low back pain  following a fall today.  EXAM: LUMBAR SPINE - COMPLETE 4+ VIEW  COMPARISON:  None.  FINDINGS: Minimal anterior spur formation at the L3-4 and L4-5 levels. No fractures, pars defects or subluxations seen.  IMPRESSION: No fracture or subluxation.  Minimal degenerative changes.   Electronically Signed   By: Gordan PaymentSteve  Reid M.D.   On: 02/26/2014 17:45   Dg Scapula Right  02/26/2014   CLINICAL DATA:  Right scapular pain following a fall from a ladder.  EXAM: RIGHT SCAPULA - 2+ VIEWS  COMPARISON:  None.  FINDINGS: Normal-appearing scapula without fracture or dislocation. Mild to moderate acromioclavicular spur formation.  IMPRESSION: 1. No fracture or dislocation. 2. Mild to moderate AC joint degenerative changes.   Electronically Signed   By: Gordan PaymentSteve  Reid M.D.   On: 02/26/2014 17:46   Dg Shoulder Right  02/26/2014   CLINICAL DATA:  Right shoulder and scapular pain following a fall from a ladder today.  EXAM: RIGHT SHOULDER - 2+ VIEW  COMPARISON:  Right scapular images obtained at the same time.  FINDINGS: There is no evidence of fracture or dislocation. There is no evidence of arthropathy or other focal bone abnormality. Soft tissues are unremarkable.  IMPRESSION: No fracture or dislocation.   Electronically Signed   By: Gordan PaymentSteve  Reid M.D.   On: 02/26/2014 17:47     EKG Interpretation None      MDM   Final diagnoses:  Fall, initial encounter  Right-sided low back pain without sciatica  Pain of right scapula  Shoulder pain, acute, right    Pt presenting with c/o pain in low back , right shoulder and right shoulder blade after mechanical fall from ladder.  No head trauma.  xrays reassuring.  Pt treated with pain meds in the ED.  Given note for work as requested, given fx for home pain meds and muscle relaxer.  Discharged with strict return precautions.  Pt agreeable with plan.    Ethelda ChickMartha K Linker, MD 02/26/14 [id number]

## 2014-07-08 ENCOUNTER — Encounter (HOSPITAL_COMMUNITY): Payer: Self-pay

## 2014-07-08 ENCOUNTER — Emergency Department (HOSPITAL_COMMUNITY)
Admission: EM | Admit: 2014-07-08 | Discharge: 2014-07-08 | Disposition: A | Discharge status: Home / Self Care | Payer: Self-pay | Attending: Emergency Medicine | Admitting: Emergency Medicine

## 2014-07-08 DIAGNOSIS — T465X5A Adverse effect of other antihypertensive drugs, initial encounter: Secondary | ICD-10-CM | POA: Insufficient documentation

## 2014-07-08 DIAGNOSIS — Y999 Unspecified external cause status: Secondary | ICD-10-CM | POA: Insufficient documentation

## 2014-07-08 DIAGNOSIS — I10 Essential (primary) hypertension: Secondary | ICD-10-CM | POA: Insufficient documentation

## 2014-07-08 DIAGNOSIS — T7840XA Allergy, unspecified, initial encounter: Secondary | ICD-10-CM

## 2014-07-08 DIAGNOSIS — Y929 Unspecified place or not applicable: Secondary | ICD-10-CM | POA: Insufficient documentation

## 2014-07-08 DIAGNOSIS — Z720 Tobacco use: Secondary | ICD-10-CM | POA: Insufficient documentation

## 2014-07-08 DIAGNOSIS — Z8639 Personal history of other endocrine, nutritional and metabolic disease: Secondary | ICD-10-CM | POA: Insufficient documentation

## 2014-07-08 DIAGNOSIS — Z79899 Other long term (current) drug therapy: Secondary | ICD-10-CM | POA: Insufficient documentation

## 2014-07-08 DIAGNOSIS — Y9389 Activity, other specified: Secondary | ICD-10-CM | POA: Insufficient documentation

## 2014-07-08 DIAGNOSIS — L539 Erythematous condition, unspecified: Secondary | ICD-10-CM | POA: Insufficient documentation

## 2014-07-08 MED ORDER — HYDROCHLOROTHIAZIDE 25 MG PO TABS: 25.0000 mg | ORAL_TABLET | Freq: Every day | ORAL | Status: DC

## 2014-07-08 MED ORDER — METHYLPREDNISOLONE SODIUM SUCC 125 MG IJ SOLR
125.0000 mg | Freq: Once | INTRAMUSCULAR | Status: AC
  Administered 2014-07-08: 125 mg via INTRAVENOUS
  Filled 2014-07-08: qty 2

## 2014-07-08 MED ORDER — DIPHENHYDRAMINE HCL 50 MG/ML IJ SOLN
25.0000 mg | Freq: Once | INTRAMUSCULAR | Status: AC
  Administered 2014-07-08: 25 mg via INTRAVENOUS
  Filled 2014-07-08: qty 1

## 2014-07-08 MED ORDER — FAMOTIDINE IN NACL 20-0.9 MG/50ML-% IV SOLN
20.0000 mg | Freq: Once | INTRAVENOUS | Status: AC
  Administered 2014-07-08: 20 mg via INTRAVENOUS
  Filled 2014-07-08: qty 50

## 2014-07-08 MED ORDER — SODIUM CHLORIDE 0.9 % IV BOLUS (SEPSIS)
1000.0000 mL | Freq: Once | INTRAVENOUS | Status: AC
  Administered 2014-07-08: 1000 mL via INTRAVENOUS

## 2014-07-08 NOTE — Discharge Instructions (Signed)
Recommend  new blood pressure medication.   Take hydrochlorothiazide 25 mg daily. Rx for 1 month with 1 refill. You will need primary care follow-up.

## 2014-07-08 NOTE — ED Notes (Signed)
Cook, MD at bedside.  

## 2014-07-08 NOTE — ED Provider Notes (Signed)
CSN: [id number]     Arrival date & time 07/08/14  1824 History   First MD Initiated Contact with Patient 07/08/14 1855     Chief Complaint  Patient presents with  . Allergic Reaction     (Consider location/radiation/quality/duration/timing/severity/associated sxs/prior Treatment) HPI...Marland Kitchen.Marland Kitchen.throat tightness, facial redness, itching, for 2 days. Patient started on lisinopril and amlodipine 2 weeks ago for her blood pressure. Difficulty swallowing. No dyspnea or wheezing. No fever sweats or chills. This never happened before. No substernal chest pain. Severity is moderate.  Past Medical History  Diagnosis Date  . Hypertension   . Hyperlipidemia   . Hypertension    Past Surgical History  Procedure Laterality Date  . Cyst removed from l breast    . Surgery on l wrist as a child     Family History  Problem Relation Age of Onset  . Diabetes Mother   . Cancer Mother   . Heart failure Father   . Hypertension Father    History  Substance Use Topics  . Smoking status: Current Every Day Smoker -- 0.50 packs/day    Types: Cigarettes    Last Attempt to Quit: 09/19/2011  . Smokeless tobacco: Never Used  . Alcohol Use: No   OB History    No data available     Review of Systems  All other systems reviewed and are negative.     Allergies  Review of patient's allergies indicates no known allergies.  Home Medications   Prior to Admission medications   Medication Sig Start Date End Date Taking? Authorizing Provider  amLODipine (NORVASC) 10 MG tablet Take 10 mg by mouth daily.   Yes Historical Provider, MD  ibuprofen (ADVIL,MOTRIN) 200 MG tablet Take 1,200 mg by mouth every 4 (four) hours as needed for moderate pain.   Yes Historical Provider, MD  lisinopril-hydrochlorothiazide (PRINZIDE,ZESTORETIC) 20-25 MG per tablet Take 1 tablet by mouth daily.   Yes Historical Provider, MD  cyclobenzaprine (FLEXERIL) 5 MG tablet Take 1 tablet (5 mg total) by mouth 3 (three) times daily as  needed for muscle spasms. Patient not taking: Reported on 07/08/2014 02/26/14   Ethelda ChickMartha K Linker, MD  hydrochlorothiazide (HYDRODIURIL) 25 MG tablet Take 1 tablet (25 mg total) by mouth daily. 07/08/14   Donnetta HutchingBrian Kyro Joswick, MD  ibuprofen (ADVIL,MOTRIN) 600 MG tablet Take 1 tablet (600 mg total) by mouth every 6 (six) hours as needed. Patient not taking: Reported on 07/08/2014 02/26/14   Ethelda ChickMartha K Linker, MD  oxyCODONE-acetaminophen (PERCOCET/ROXICET) 5-325 MG per tablet Take 1-2 tablets by mouth every 6 (six) hours as needed for severe pain. Patient not taking: Reported on 07/08/2014 02/26/14   Ethelda ChickMartha K Linker, MD   BP 115/83 mmHg  Pulse 79  Temp(Src) 98.6 F (37 C) (Oral)  Resp 23  SpO2 96%  LMP 07/08/2014 Physical Exam  Constitutional: She is oriented to person, place, and time. She appears well-developed and well-nourished.  No respiratory distress  HENT:  Head: Normocephalic and atraumatic.  Eyes: Conjunctivae and EOM are normal. Pupils are equal, round, and reactive to light.  Neck: Normal range of motion. Neck supple.  Cardiovascular: Normal rate, regular rhythm and normal heart sounds.   Pulmonary/Chest: Effort normal and breath sounds normal.  Abdominal: Soft. Bowel sounds are normal.  Musculoskeletal: Normal range of motion.  Neurological: She is alert and oriented to person, place, and time.  Skin: Skin is warm and dry.  Neck and facial erythema  Psychiatric: She has a normal mood and affect. Her behavior is  normal.  Nursing note and vitals reviewed.   ED Course  Procedures (including critical care time) Labs Review Labs Reviewed - No data to display  Imaging Review No results found.   EKG Interpretation   Date/Time:  Saturday July 08 2014 18:45:07 EST Ventricular Rate:  95 PR Interval:  116 QRS Duration: 98 QT Interval:  348 QTC Calculation: 437 R Axis:   86 Text Interpretation:  Normal sinus rhythm Normal ECG Confirmed by Adriana SimasOOK   MD, Tikia Skilton ([id number]) on 07/08/2014  8:16:45 PM      MDM   Final diagnoses:  Allergic reaction, initial encounter   Patient feels much better after IV steroids, IV Pepcid, IV Benadryl. Discontinue other blood pressure medicine. Start hydrochlorothiazide 25 mg daily. You will need primary care follow-up.    Donnetta HutchingBrian Loveda Colaizzi, MD 07/08/14 2216

## 2014-07-08 NOTE — ED Notes (Signed)
Pt. Now reporting chest palpitations.

## 2014-07-08 NOTE — ED Notes (Signed)
Per EMS, patient reports recently started on lisinopril and amlodipine x2 weeks. States every time she takes it she feels like she's "choking". Per EMS, airway clear, lung sounds clear, no stridor/wheezing. Given 50mg  benadryl.

## 2015-07-20 ENCOUNTER — Encounter (HOSPITAL_COMMUNITY): Payer: Self-pay

## 2015-07-20 ENCOUNTER — Emergency Department (HOSPITAL_COMMUNITY): Payer: Self-pay

## 2015-07-20 ENCOUNTER — Emergency Department (HOSPITAL_COMMUNITY)
Admission: EM | Admit: 2015-07-20 | Discharge: 2015-07-20 | Disposition: A | Discharge status: Home / Self Care | Payer: Self-pay | Attending: Emergency Medicine | Admitting: Emergency Medicine

## 2015-07-20 DIAGNOSIS — Z79899 Other long term (current) drug therapy: Secondary | ICD-10-CM | POA: Insufficient documentation

## 2015-07-20 DIAGNOSIS — R05 Cough: Secondary | ICD-10-CM | POA: Insufficient documentation

## 2015-07-20 DIAGNOSIS — Z8639 Personal history of other endocrine, nutritional and metabolic disease: Secondary | ICD-10-CM | POA: Insufficient documentation

## 2015-07-20 DIAGNOSIS — I10 Essential (primary) hypertension: Secondary | ICD-10-CM | POA: Insufficient documentation

## 2015-07-20 DIAGNOSIS — R059 Cough, unspecified: Secondary | ICD-10-CM

## 2015-07-20 DIAGNOSIS — R630 Anorexia: Secondary | ICD-10-CM | POA: Insufficient documentation

## 2015-07-20 DIAGNOSIS — R5381 Other malaise: Secondary | ICD-10-CM | POA: Insufficient documentation

## 2015-07-20 DIAGNOSIS — F17210 Nicotine dependence, cigarettes, uncomplicated: Secondary | ICD-10-CM | POA: Insufficient documentation

## 2015-07-20 DIAGNOSIS — Z3202 Encounter for pregnancy test, result negative: Secondary | ICD-10-CM | POA: Insufficient documentation

## 2015-07-20 DIAGNOSIS — R5383 Other fatigue: Secondary | ICD-10-CM | POA: Insufficient documentation

## 2015-07-20 LAB — CBC WITH DIFFERENTIAL/PLATELET
BASOS ABS: 0 10*3/uL (ref 0.0–0.1)
BASOS PCT: 0 %
EOS ABS: 0.2 10*3/uL (ref 0.0–0.7)
Eosinophils Relative: 2 %
HCT: 44.8 % (ref 36.0–46.0)
Hemoglobin: 15.4 g/dL — ABNORMAL HIGH (ref 12.0–15.0)
Lymphocytes Relative: 22 %
Lymphs Abs: 2.3 10*3/uL (ref 0.7–4.0)
MCH: 32.7 pg (ref 26.0–34.0)
MCHC: 34.4 g/dL (ref 30.0–36.0)
MCV: 95.1 fL (ref 78.0–100.0)
MONO ABS: 0.7 10*3/uL (ref 0.1–1.0)
MONOS PCT: 7 %
NEUTROS ABS: 7.1 10*3/uL (ref 1.7–7.7)
Neutrophils Relative %: 69 %
PLATELETS: 267 10*3/uL (ref 150–400)
RBC: 4.71 MIL/uL (ref 3.87–5.11)
RDW: 12.9 % (ref 11.5–15.5)
WBC: 10.3 10*3/uL (ref 4.0–10.5)

## 2015-07-20 LAB — URINALYSIS, ROUTINE W REFLEX MICROSCOPIC
Bilirubin Urine: NEGATIVE
GLUCOSE, UA: NEGATIVE mg/dL
HGB URINE DIPSTICK: NEGATIVE
Ketones, ur: NEGATIVE mg/dL
LEUKOCYTES UA: NEGATIVE
Nitrite: NEGATIVE
PROTEIN: NEGATIVE mg/dL
SPECIFIC GRAVITY, URINE: 1.024 (ref 1.005–1.030)
pH: 7 (ref 5.0–8.0)

## 2015-07-20 LAB — PREGNANCY, URINE: PREG TEST UR: NEGATIVE

## 2015-07-20 LAB — COMPREHENSIVE METABOLIC PANEL
ALBUMIN: 4.1 g/dL (ref 3.5–5.0)
ALT: 15 U/L (ref 14–54)
ANION GAP: 9 (ref 5–15)
AST: 22 U/L (ref 15–41)
Alkaline Phosphatase: 82 U/L (ref 38–126)
BUN: 15 mg/dL (ref 6–20)
CHLORIDE: 99 mmol/L — AB (ref 101–111)
CO2: 32 mmol/L (ref 22–32)
Calcium: 9.4 mg/dL (ref 8.9–10.3)
Creatinine, Ser: 0.67 mg/dL (ref 0.44–1.00)
GFR calc Af Amer: >60 mL/min (ref >60)
GFR calc non Af Amer: >60 mL/min (ref >60)
GLUCOSE: 104 mg/dL — AB (ref 65–99)
Potassium: 3.5 mmol/L (ref 3.5–5.1)
SODIUM: 140 mmol/L (ref 135–145)
TOTAL PROTEIN: 7 g/dL (ref 6.5–8.1)
Total Bilirubin: 0.6 mg/dL (ref 0.3–1.2)

## 2015-07-20 LAB — T4, FREE: Free T4: 0.79 ng/dL (ref 0.61–1.12)

## 2015-07-20 LAB — MAGNESIUM: Magnesium: 2 mg/dL (ref 1.7–2.4)

## 2015-07-20 LAB — TSH: TSH: 1.69 u[IU]/mL (ref 0.350–4.500)

## 2015-07-20 MED ORDER — IBUPROFEN 200 MG PO TABS
600.0000 mg | ORAL_TABLET | Freq: Once | ORAL | Status: AC
  Administered 2015-07-20: 600 mg via ORAL
  Filled 2015-07-20: qty 3

## 2015-07-20 MED ORDER — SODIUM CHLORIDE 0.9 % IV BOLUS (SEPSIS)
1000.0000 mL | Freq: Once | INTRAVENOUS | Status: AC
  Administered 2015-07-20: 1000 mL via INTRAVENOUS

## 2015-07-20 NOTE — ED Notes (Signed)
Pt with cough x 1 week.  Pt has felt dizzy last two days.  ? Fever at night time.  Taking cold and flu without relief.

## 2015-07-20 NOTE — Discharge Instructions (Signed)
Please follow-up with your primary care. We did not find anything serious today. Drink plenty of fluids and keep well nourished. Return for worsening symptoms, including passing out or feeling more and more lightheaded, difficulty breathing, chest pain, or any other symptoms concerning.  Fatigue Fatigue is feeling tired all of the time, a lack of energy, or a lack of motivation. Occasional or mild fatigue is often a normal response to activity or life in general. However, long-lasting (chronic) or extreme fatigue may indicate an underlying medical condition. HOME CARE INSTRUCTIONS  Watch your fatigue for any changes. The following actions may help to lessen any discomfort you are feeling:  Talk to your health care provider about how much sleep you need each night. Try to get the required amount every night.  Take medicines only as directed by your health care provider.  Eat a healthy and nutritious diet. Ask your health care provider if you need help changing your diet.  Drink enough fluid to keep your urine clear or pale yellow.  Practice ways of relaxing, such as yoga, meditation, massage therapy, or acupuncture.  Exercise regularly.   Change situations that cause you stress. Try to keep your work and personal routine reasonable.  Do not abuse illegal drugs.  Limit alcohol intake to no more than 1 drink per day for nonpregnant women and 2 drinks per day for men. One drink equals 12 ounces of beer, 5 ounces of wine, or 1 ounces of hard liquor.  Take a multivitamin, if directed by your health care provider. SEEK MEDICAL CARE IF:   Your fatigue does not get better.  You have a fever.   You have unintentional weight loss or gain.  You have headaches.   You have difficulty:   Falling asleep.  Sleeping throughout the night.  You feel angry, guilty, anxious, or sad.   You are unable to have a bowel movement (constipation).   You skin is dry.   Your legs or another  part of your body is swollen.  SEEK IMMEDIATE MEDICAL CARE IF:   You feel confused.   Your vision is blurry.  You feel faint or pass out.   You have a severe headache.   You have severe abdominal, pelvic, or back pain.   You have chest pain, shortness of breath, or an irregular or fast heartbeat.   You are unable to urinate or you urinate less than normal.   You develop abnormal bleeding, such as bleeding from the rectum, vagina, nose, lungs, or nipples.  You vomit blood.   You have thoughts about harming yourself or committing suicide.   You are worried that you might harm someone else.    This information is not intended to replace advice given to you by your health care provider. Make sure you discuss any questions you have with your health care provider.   Document Released: 06/08/2007 Document Revised: 09/01/2014 Document Reviewed: 12/13/2013 Elsevier Interactive Patient Education 2016 Else[employer]  Cough, Adult A cough helps to clear your throat and lungs. A cough may last only 2-3 weeks (acute), or it may last longer than 8 weeks (chronic). Many different things can cause a cough. A cough may be a sign of an illness or another medical condition. HOME CARE  Pay attention to any changes in your cough.  Take medicines only as told by your doctor.  If you were prescribed an antibiotic medicine, take it as told by your doctor. Do not stop taking it even if  you start to feel better.  Talk with your doctor before you try using a cough medicine.  Drink enough fluid to keep your pee (urine) clear or pale yellow.  If the air is dry, use a cold steam vaporizer or humidifier in your home.  Stay away from things that make you cough at work or at home.  If your cough is worse at night, try using extra pillows to raise your head up higher while you sleep.  Do not smoke, and try not to be around smoke. If you need help quitting, ask your doctor.  Do not have  caffeine.  Do not drink alcohol.  Rest as needed. GET HELP IF:  You have new problems (symptoms).  You cough up yellow fluid (pus).  Your cough does not get better after 2-3 weeks, or your cough gets worse.  Medicine does not help your cough and you are not sleeping well.  You have pain that gets worse or pain that is not helped with medicine.  You have a fever.  You are losing weight and you do not know why.  You have night sweats. GET HELP RIGHT AWAY IF:  You cough up blood.  You have trouble breathing.  Your heartbeat is very fast.   This information is not intended to replace advice given to you by your health care provider. Make sure you discuss any questions you have with your health care provider.   Document Released: 04/24/2011 Document Revised: 05/02/2015 Document Reviewed: 10/18/2014 Elsevier Interactive Patient Education [employer] Inc[employer]

## 2015-07-20 NOTE — ED Provider Notes (Signed)
CSN: [id number]     Arrival date & time 07/20/15  1128 History   First MD Initiated Contact with Patient 07/20/15 1630     Chief Complaint  Patient presents with  . Cough     (Consider location/radiation/quality/duration/timing/severity/associated sxs/prior Treatment) HPI 49 year old female who presents with cough and fatigue. History of hypertension and hyperlipidemia. States that for the past week she has had nonproductive cough, with generalized malaise, and fatigue. Has had decreased appetite in the setting. Feels like she has been urinating more, and has been more thirsty. Denies any polyphagia or any weight changes at this. This week, as noticed that she has been feeling more lightheaded especially when standing or walking. Denies any syncope, chest pain, difficulty breathing, leg swelling, leg pain. No nausea, vomiting, diarrhea, or abdominal pain. No known sick contacts.   Past Medical History  Diagnosis Date  . Hypertension   . Hyperlipidemia   . Hypertension    Past Surgical History  Procedure Laterality Date  . Cyst removed from l breast    . Surgery on l wrist as a child     Family History  Problem Relation Age of Onset  . Diabetes Mother   . Cancer Mother   . Heart failure Father   . Hypertension Father    Social History  Substance Use Topics  . Smoking status: Current Every Day Smoker -- 0.50 packs/day    Types: Cigarettes    Last Attempt to Quit: 09/19/2011  . Smokeless tobacco: Never Used  . Alcohol Use: No   OB History    No data available     Review of Systems 10/14 systems reviewed and are negative other than those stated in the HPI   Allergies  Review of patient's allergies indicates no known allergies.  Home Medications   Prior to Admission medications   Medication Sig Start Date End Date Taking? Authorizing Provider  amLODipine (NORVASC) 10 MG tablet Take 10 mg by mouth daily.   Yes Historical Provider, MD  Chlorphen-Phenyleph-APAP  (CORICIDIN D COLD/FLU/SINUS) 2-5-325 MG TABS Take 1 tablet by mouth 2 (two) times daily as needed (cold symptoms).   Yes Historical Provider, MD  cyclobenzaprine (FLEXERIL) 5 MG tablet Take 1 tablet (5 mg total) by mouth 3 (three) times daily as needed for muscle spasms. Patient not taking: Reported on 07/08/2014 02/26/14   Jerelyn ScottMartha Linker, MD  hydrochlorothiazide (HYDRODIURIL) 25 MG tablet Take 1 tablet (25 mg total) by mouth daily. Patient not taking: Reported on 07/20/2015 07/08/14   Donnetta HutchingBrian Cook, MD  ibuprofen (ADVIL,MOTRIN) 600 MG tablet Take 1 tablet (600 mg total) by mouth every 6 (six) hours as needed. Patient not taking: Reported on 07/08/2014 02/26/14   Jerelyn ScottMartha Linker, MD  oxyCODONE-acetaminophen (PERCOCET/ROXICET) 5-325 MG per tablet Take 1-2 tablets by mouth every 6 (six) hours as needed for severe pain. Patient not taking: Reported on 07/08/2014 02/26/14   Jerelyn ScottMartha Linker, MD   BP 140/90 mmHg  Pulse 87  Temp(Src) 98 F (36.7 C) (Oral)  Resp 22  SpO2 97%  LMP 07/15/2015 Physical Exam Physical Exam  Nursing note and vitals reviewed. Constitutional: Well developed, well nourished, non-toxic, and in no acute distress Head: Normocephalic and atraumatic.  Mouth/Throat: Oropharynx is clear. Mucous membranes mildly dry.  Neck: Normal range of motion. Neck supple.  Cardiovascular: Normal rate and regular rhythm.  No edema. Pulmonary/Chest: Effort normal and breath sounds normal.  Abdominal: Soft. There is no tenderness. There is no rebound and no guarding.  Musculoskeletal: Normal range  of motion.  Neurological: Alert, no facial droop, fluent speech, moves all extremities symmetrically Skin: Skin is warm and dry.  Psychiatric: Cooperative  ED Course  Procedures (including critical care time) Labs Review Labs Reviewed  CBC WITH DIFFERENTIAL/PLATELET - Abnormal; Notable for the following:    Hemoglobin 15.4 (*)    All other components within normal limits  COMPREHENSIVE METABOLIC PANEL -  Abnormal; Notable for the following:    Chloride 99 (*)    Glucose, Bld 104 (*)    All other components within normal limits  URINALYSIS, ROUTINE W REFLEX MICROSCOPIC (NOT AT [facility]) - Abnormal; Notable for the following:    APPearance CLOUDY (*)    All other components within normal limits  PREGNANCY, URINE  MAGNESIUM  TSH  T4, FREE    Imaging Review Dg Chest 2 View  07/20/2015  CLINICAL DATA:  Productive cough with congestion and lightheadedness as well as increased thirst 1-2 weeks. Mid chest pain 2 days ago resolved. EXAM: CHEST  2 VIEW COMPARISON:  06/02/2011 FINDINGS: Lungs are adequately inflated without consolidation or effusion. Cardiomediastinal silhouette and remainder of the exam is unchanged. IMPRESSION: No active cardiopulmonary disease. Electronically Signed   By: Elberta Fortis M.D.   On: 07/20/2015 12:33   I have personally reviewed and evaluated these images and lab results as part of my medical decision-making.   EKG Interpretation   Date/Time:  Friday July 20 2015 11:39:53 EST Ventricular Rate:  82 PR Interval:  114 QRS Duration: 92 QT Interval:  363 QTC Calculation: 424 R Axis:   65 Text Interpretation:  Age not entered, assumed to be  49 years old for  purpose of ECG interpretation Sinus rhythm Borderline short PR interval  LAE, consider biatrial enlargement Borderline T abnormalities, inferior  leads since last tracing no significant change Confirmed by BELFI  MD,  MELANIE ([id number]) on 07/20/2015 12:05:25 PM      MDM   Final diagnoses:  Cough  Malaise and fatigue   In short, this is a 49 year old female who presents with cough and malaise for one week. She is nontoxic in no acute distress. Vital signs are non-concerning. Exam is unremarkable and nonfocal. Chest x-ray shows no acute cardiopulmonary processes. EKG shows sinus rhythm, without acute ischemic changes and without stigmata of arrhythmia. Basic blood work unremarkable for major electrolyte,  metabolic, and infectious processes. Given IVF and ibuprofen with improvement in symptoms. Felt stable for discharge home with supportive management and close PCP follow-up. Strict return and follow-up instructions reviewed. She/He expressed understanding of all discharge instructions and felt comfortable with the plan of care.    Lavera Guise, MD 07/20/15 [phone]

## 2015-09-13 ENCOUNTER — Emergency Department (HOSPITAL_COMMUNITY)
Admission: EM | Admit: 2015-09-13 | Discharge: 2015-09-14 | Disposition: A | Discharge status: Home / Self Care | Payer: Self-pay | Attending: Emergency Medicine | Admitting: Emergency Medicine

## 2015-09-13 DIAGNOSIS — I10 Essential (primary) hypertension: Secondary | ICD-10-CM | POA: Insufficient documentation

## 2015-09-13 DIAGNOSIS — R110 Nausea: Secondary | ICD-10-CM | POA: Insufficient documentation

## 2015-09-13 DIAGNOSIS — Z9889 Other specified postprocedural states: Secondary | ICD-10-CM | POA: Insufficient documentation

## 2015-09-13 DIAGNOSIS — D72829 Elevated white blood cell count, unspecified: Secondary | ICD-10-CM | POA: Insufficient documentation

## 2015-09-13 DIAGNOSIS — F17210 Nicotine dependence, cigarettes, uncomplicated: Secondary | ICD-10-CM | POA: Insufficient documentation

## 2015-09-13 DIAGNOSIS — N61 Mastitis without abscess: Secondary | ICD-10-CM | POA: Insufficient documentation

## 2015-09-13 DIAGNOSIS — Z8639 Personal history of other endocrine, nutritional and metabolic disease: Secondary | ICD-10-CM | POA: Insufficient documentation

## 2015-09-13 DIAGNOSIS — Z79899 Other long term (current) drug therapy: Secondary | ICD-10-CM | POA: Insufficient documentation

## 2015-09-13 DIAGNOSIS — N611 Abscess of the breast and nipple: Secondary | ICD-10-CM | POA: Insufficient documentation

## 2015-09-13 LAB — CBC WITH DIFFERENTIAL/PLATELET
Basophils Absolute: 0 10*3/uL (ref 0.0–0.1)
Basophils Relative: 0 %
EOS PCT: 1 %
Eosinophils Absolute: 0.2 10*3/uL (ref 0.0–0.7)
HCT: 42.5 % (ref 36.0–46.0)
Hemoglobin: 14.8 g/dL (ref 12.0–15.0)
LYMPHS PCT: 15 %
Lymphs Abs: 2.4 10*3/uL (ref 0.7–4.0)
MCH: 32.5 pg (ref 26.0–34.0)
MCHC: 34.8 g/dL (ref 30.0–36.0)
MCV: 93.2 fL (ref 78.0–100.0)
Monocytes Absolute: 1.1 10*3/uL — ABNORMAL HIGH (ref 0.1–1.0)
Monocytes Relative: 7 %
NEUTROS ABS: 12.4 10*3/uL — AB (ref 1.7–7.7)
Neutrophils Relative %: 77 %
Platelets: 276 10*3/uL (ref 150–400)
RBC: 4.56 MIL/uL (ref 3.87–5.11)
RDW: 13.3 % (ref 11.5–15.5)
WBC: 16 10*3/uL — AB (ref 4.0–10.5)

## 2015-09-13 LAB — BASIC METABOLIC PANEL
ANION GAP: 8 (ref 5–15)
BUN: 14 mg/dL (ref 6–20)
CALCIUM: 8.8 mg/dL — AB (ref 8.9–10.3)
CO2: 24 mmol/L (ref 22–32)
Chloride: 105 mmol/L (ref 101–111)
Creatinine, Ser: 0.61 mg/dL (ref 0.44–1.00)
GLUCOSE: 108 mg/dL — AB (ref 65–99)
POTASSIUM: 4 mmol/L (ref 3.5–5.1)
Sodium: 137 mmol/L (ref 135–145)

## 2015-09-13 LAB — I-STAT CG4 LACTIC ACID, ED: Lactic Acid, Venous: 1.05 mmol/L (ref 0.5–2.0)

## 2015-09-13 MED ORDER — SODIUM CHLORIDE 0.9 % IV BOLUS (SEPSIS)
1000.0000 mL | Freq: Once | INTRAVENOUS | Status: AC
  Administered 2015-09-13: 1000 mL via INTRAVENOUS

## 2015-09-13 MED ORDER — ONDANSETRON HCL 4 MG/2ML IJ SOLN
4.0000 mg | Freq: Once | INTRAMUSCULAR | Status: AC
  Administered 2015-09-13: 4 mg via INTRAVENOUS
  Filled 2015-09-13: qty 2

## 2015-09-13 MED ORDER — VANCOMYCIN HCL IN DEXTROSE 1-5 GM/200ML-% IV SOLN
1000.0000 mg | Freq: Once | INTRAVENOUS | Status: AC
  Administered 2015-09-14: 1000 mg via INTRAVENOUS
  Filled 2015-09-13: qty 200

## 2015-09-13 MED ORDER — MORPHINE SULFATE (PF) 4 MG/ML IV SOLN
4.0000 mg | Freq: Once | INTRAVENOUS | Status: AC
  Administered 2015-09-13: 4 mg via INTRAVENOUS
  Filled 2015-09-13: qty 1

## 2015-09-13 NOTE — ED Provider Notes (Signed)
CSN: [id number]     Arrival date & time 09/13/15  1955 History   First MD Initiated Contact with Patient 09/13/15 2236     Chief Complaint  Patient presents with  . Breast Problem     (Consider location/radiation/quality/duration/timing/severity/associated sxs/prior Treatment) HPI Comments: Rachael Calhoun is a 50 y.o. female with a PMHx of HTN, HLD, and R breast abscess s/p surgical I&D 3yrs ago, who presents to the ED with complaints of 4 days of gradually worsening right breast pain and swelling around the areola. Patient states this feels very similar to when she had a right breast abscess years ago which she needed surgical drainage for. She describes the pain is 10/10 constant throbbing nonradiating pain at the right breast areola region, worse with movement and pressure to the area, and unrelieved with Tylenol, Advil, and Aleve. Associated symptoms include swelling "approximately the size of a baseball", erythema, warmth, nipple inversion, red streaking, and nausea. She denies any fevers, chills, chest pain, shortness breath, abdominal pain, vomiting, diarrhea, constipation, dysuria, hematuria, myalgias, arthralgias, drainage from the area, nipple discharge, numbness, tingling, or weakness. She denies any known skin injury or insect bites, denies IV drug use, or history of immunosuppressant conditions such as diabetes or HIV.   Patient is a 50 y.o. female presenting with abscess. The history is provided by the patient. No language interpreter was used.  Abscess Location:  Torso Torso abscess location:  R chest Size:  "the size of a baseball" Abscess quality: induration, painful, redness and warmth   Abscess quality: not draining and not weeping   Red streaking: yes   Duration:  4 days Progression:  Worsening Pain details:    Quality:  Throbbing   Severity:  Severe   Duration:  4 days   Timing:  Constant   Progression:  Worsening Chronicity:  Recurrent Context: not diabetes, not  immunosuppression, not injected drug use, not insect bite/sting and not skin injury   Relieved by:  Nothing Exacerbated by: pressure to the area, movement. Ineffective treatments:  NSAIDs (and tylenol) Associated symptoms: nausea   Associated symptoms: no fever and no vomiting   Risk factors: prior abscess     Past Medical History  Diagnosis Date  . Hypertension   . Hyperlipidemia   . Hypertension    Past Surgical History  Procedure Laterality Date  . Cyst removed from l breast    . Surgery on l wrist as a child     Family History  Problem Relation Age of Onset  . Diabetes Mother   . Cancer Mother   . Heart failure Father   . Hypertension Father    Social History  Substance Use Topics  . Smoking status: Current Every Day Smoker -- 0.50 packs/day    Types: Cigarettes    Last Attempt to Quit: 09/19/2011  . Smokeless tobacco: Never Used  . Alcohol Use: No   OB History    No data available     Review of Systems  Constitutional: Negative for fever and chills.  Respiratory: Negative for shortness of breath.   Cardiovascular: Negative for chest pain.  Gastrointestinal: Positive for nausea. Negative for vomiting, abdominal pain, diarrhea and constipation.  Genitourinary: Negative for dysuria and hematuria.  Musculoskeletal: Negative for myalgias and arthralgias.  Skin: Positive for color change.  Allergic/Immunologic: Negative for immunocompromised state.  Neurological: Negative for weakness and numbness.  Psychiatric/Behavioral: Negative for confusion.   10 Systems reviewed and are negative for acute change except as noted in the  HPI.    Allergies  Review of patient's allergies indicates no known allergies.  Home Medications   Prior to Admission medications   Medication Sig Start Date End Date Taking? Authorizing Provider  hydrochlorothiazide (HYDRODIURIL) 25 MG tablet Take 1 tablet (25 mg total) by mouth daily. 07/08/14  Yes Donnetta Hutching, MD   BP 163/103 mmHg   Pulse 86  Temp(Src) 98.5 F (36.9 C) (Oral)  Resp 20  SpO2 97%  LMP 08/23/2015 (Approximate) Physical Exam  Constitutional: She is oriented to person, place, and time. Vital signs are normal. She appears well-developed and well-nourished.  Non-toxic appearance. She appears distressed (uncomfortable appearing).  Afebrile, nontoxic, uncomfortable appearing. Mildly hypertensive  HENT:  Head: Normocephalic and atraumatic.  Mouth/Throat: Oropharynx is clear and moist and mucous membranes are normal.  Eyes: Conjunctivae and EOM are normal. Right eye exhibits no discharge. Left eye exhibits no discharge.  Neck: Normal range of motion. Neck supple.  Cardiovascular: Normal rate, regular rhythm, normal heart sounds and intact distal pulses.  Exam reveals no gallop and no friction rub.   No murmur heard. Pulmonary/Chest: Effort normal and breath sounds normal. No respiratory distress. She has no decreased breath sounds. She has no wheezes. She has no rhonchi. She has no rales. Right breast exhibits inverted nipple, mass, nipple discharge (crusting), skin change and tenderness.    Pendulous breasts.  R breast with ~7cm area of induration around the areola with minimal fluctuance underneath the areola, exquisitely TTP, erythematous and warm to touch, with nipple inversion and some crusting near the nipple, red streak superior to the abscess extending towards the chest wall.   Abdominal: Soft. Normal appearance and bowel sounds are normal. She exhibits no distension. There is no tenderness. There is no rigidity, no rebound, no guarding, no CVA tenderness, no tenderness at McBurney's point and negative Murphy's sign.  Musculoskeletal: Normal range of motion.  Lymphadenopathy:    She has no axillary adenopathy.  No axillary LAD noted  Neurological: She is alert and oriented to person, place, and time. She has normal strength. No sensory deficit.  Skin: Skin is warm, dry and intact. No rash noted. There is  erythema.  R breast abscess as noted above  Psychiatric: She has a normal mood and affect.  Nursing note and vitals reviewed.   ED Course  Procedures (including critical care time) Labs Review Labs Reviewed  CBC WITH DIFFERENTIAL/PLATELET - Abnormal; Notable for the following:    WBC 16.0 (*)    Neutro Abs 12.4 (*)    Monocytes Absolute 1.1 (*)    All other components within normal limits  BASIC METABOLIC PANEL - Abnormal; Notable for the following:    Glucose, Bld 108 (*)    Calcium 8.8 (*)    All other components within normal limits  I-STAT CG4 LACTIC ACID, ED    Imaging Review No results found. I have personally reviewed and evaluated these images and lab results as part of my medical decision-making.   EKG Interpretation None      MDM   Final diagnoses:  Abscess of right breast  Cellulitis of right breast  Leukocytosis  Essential hypertension  Nausea    50 y.o. female here with right breast abscess 4 days. Has needed surgery in the past for abscess. On exam, R breast with approx 7cm area of induration around areola, minimal fluctuance near the bottom of the indurtated area, very tender to palpation, +erythema and warmth with nipple inversion and red streaking up towards the  chest. Some crusting near the nipple. Will get labs, lactic, and likely consult surgery once labs return. Will give pain meds and nausea meds. Will reassess shortly. Will have Dr. Hyacinth Meeker my attending see pt as well, and decide on whether surgery needs to see pt or not, but will await labs.  11:34 PM  Lactic WNL. CBC showing leukocytosis of 16.0 with neutrophilic predominance. BMP pending. Will proceed with vancomycin now. Dr. Hyacinth Meeker will go see now.   11:51 PM Dr. Hyacinth Meeker in to see pt, states he agrees with surgery consult to see if perhaps she can be seen in the office tomorrow for needle drainage/I&D. BMP returning WNL. Will consult surgery, vanc ordered.  12:28 AM Dr. Maisie Fus of [facility]  returning page, states that pt can call in the morning and will be seen for outpt management of the breast abscess. Pain still severe, Dr. Hyacinth Meeker ordered fentanyl as I was ordering more pain meds as well. Will proceed with fentanyl to see if we can get adequate control of pain. Will reassess shortly.   12:58 AM Pain slightly improved, requested percocet before she leaves so she doesn't have to stop at pharmacy. Will give 2 percocet now. Will need to finish vanc prior to d/c. Pt going home with percocet, naprosyn, bactrim, and zofran. F/up with [facility] in the morning for further management of abscess. Discussed warm compresses. I explained the diagnosis and have given explicit precautions to return to the ER including for any other new or worsening symptoms. The patient understands and accepts the medical plan as it's been dictated and I have answered their questions. Discharge instructions concerning home care and prescriptions have been given. The patient is STABLE and is discharged to home in good condition.  BP 147/90 mmHg  Pulse 84  Temp(Src) 98.4 F (36.9 C) (Oral)  Resp 18  SpO2 97%  LMP 08/23/2015 (Approximate)  Meds ordered this encounter  Medications  . sodium chloride 0.9 % bolus 1,000 mL    Sig:   . morphine 4 MG/ML injection 4 mg    Sig:   . ondansetron (ZOFRAN) injection 4 mg    Sig:   . vancomycin (VANCOCIN) IVPB 1000 mg/200 mL premix    Sig:     Order Specific Question:  Indication:    Answer:  Cellulitis  . fentaNYL (SUBLIMAZE) injection 50 mcg    Sig:   . oxyCODONE-acetaminophen (PERCOCET) 5-325 MG tablet    Sig: Take 1 tablet by mouth every 6 (six) hours as needed for severe pain.    Dispense:  20 tablet    Refill:  0  . ondansetron (ZOFRAN) 8 MG tablet    Sig: Take 1 tablet (8 mg total) by mouth every 8 (eight) hours as needed for nausea or vomiting.    Dispense:  10 tablet    Refill:  0  . naproxen (NAPROSYN) 500 MG tablet    Sig: Take 1 tablet (500 mg total) by  mouth 2 (two) times daily as needed for mild pain, moderate pain or headache (TAKE WITH MEALS.).    Dispense:  20 tablet    Refill:  0  . sulfamethoxazole-trimethoprim (BACTRIM DS,SEPTRA DS) 800-160 MG tablet    Sig: Take 1 tablet by mouth 2 (two) times daily.    Dispense:  14 tablet    Refill:  0  . oxyCODONE-acetaminophen (PERCOCET/ROXICET) 5-325 MG per tablet 2 tablet    Sig:      Alyzza Andringa Camprubi-Soms, PA-C 09/14/15 0100  Eber Hong, MD  09/15/15 0003 

## 2015-09-13 NOTE — ED Notes (Signed)
Pt states that she has had an infected appearing area on her R breast where her areola is, approximately the size of a baseball. Hx of same 20 years ago that required surgery. Alert and oriented.

## 2015-09-13 NOTE — ED Provider Notes (Signed)
Patient is a 50 year old female, she has a prior history of a breast abscess, many years ago she had surgery, she now reports that over the last 4 days she has had worsening redness swelling and tenderness around the nipple on the right breast. On exam she does have some tenderness, induration and cellulitis of the skin without obvious areas of fluctuance. The nipple is partially retracted, there is some asymmetry. She is not tachycardic, her vital signs are rather normal as well. Labs show leukocytosis as expected with an infection. We'll need discussion with the general surgeon to help with follow-up and potential surgical drainage of an abscess that may be brewing. That being said the patient is not ill-appearing, she has no systemic signs or symptoms of serious infection, vancomycin given, anticipate discharge on Bactrim  Medical screening examination/treatment/procedure(s) were conducted as a shared visit with non-physician practitioner(s) and myself.  I personally evaluated the patient during the encounter.  Clinical Impression:   Final diagnoses:  Abscess of right breast  Cellulitis of right breast  Leukocytosis  Essential hypertension  Nausea         Eber Hong, MD 09/15/15 0003

## 2015-09-14 MED ORDER — NAPROXEN 500 MG PO TABS: 500.0000 mg | ORAL_TABLET | Freq: Two times a day (BID) | ORAL | Status: DC | PRN

## 2015-09-14 MED ORDER — OXYCODONE-ACETAMINOPHEN 5-325 MG PO TABS: 1.0000 | ORAL_TABLET | Freq: Four times a day (QID) | ORAL | Status: DC | PRN

## 2015-09-14 MED ORDER — SULFAMETHOXAZOLE-TRIMETHOPRIM 800-160 MG PO TABS: 1.0000 | ORAL_TABLET | Freq: Two times a day (BID) | ORAL | Status: DC

## 2015-09-14 MED ORDER — FENTANYL CITRATE (PF) 100 MCG/2ML IJ SOLN
50.0000 ug | Freq: Once | INTRAMUSCULAR | Status: AC
  Administered 2015-09-14: 50 ug via INTRAVENOUS
  Filled 2015-09-14: qty 2

## 2015-09-14 MED ORDER — ONDANSETRON HCL 8 MG PO TABS: 8.0000 mg | ORAL_TABLET | Freq: Three times a day (TID) | ORAL | Status: DC | PRN

## 2015-09-14 MED ORDER — OXYCODONE-ACETAMINOPHEN 5-325 MG PO TABS
2.0000 | ORAL_TABLET | Freq: Once | ORAL | Status: AC
  Administered 2015-09-14: 2 via ORAL
  Filled 2015-09-14: qty 2

## 2015-09-14 MED ORDER — HYDROMORPHONE HCL 1 MG/ML IJ SOLN: 1.0000 mg | Freq: Once | INTRAMUSCULAR | Status: DC

## 2015-09-14 NOTE — Discharge Instructions (Signed)
Keep skin clean and dry. Apply warm compresses to affected area throughout the day. Take antibiotic until it is finished. Take naprosyn and percocet as directed, as needed for pain but do not drive or operate machinery with pain medication use. Use zofran as needed for nausea. Call the surgeon listed above at 8:30am on Friday 09/14/15 to schedule an appointment today for a visit to have ongoing management of your abscess, including drainage. Monitor area for signs of infection to include, but not limited to: increasing pain, spreading redness, drainage/pus, worsening swelling, or fevers. Return to emergency department for emergent changing or worsening symptoms.

## 2015-10-01 ENCOUNTER — Other Ambulatory Visit: Payer: Self-pay | Admitting: General Surgery

## 2015-10-08 ENCOUNTER — Ambulatory Visit (HOSPITAL_COMMUNITY)
Admission: RE | Admit: 2015-10-08 | Discharge: 2015-10-08 | Disposition: A | Discharge status: Home / Self Care | Payer: Self-pay | Source: Ambulatory Visit | Attending: General Surgery | Admitting: General Surgery

## 2015-10-08 ENCOUNTER — Ambulatory Visit (HOSPITAL_COMMUNITY): Payer: Self-pay | Admitting: Anesthesiology

## 2015-10-08 ENCOUNTER — Encounter (HOSPITAL_COMMUNITY): Payer: Self-pay | Admitting: *Deleted

## 2015-10-08 ENCOUNTER — Encounter (HOSPITAL_COMMUNITY)
Admission: RE | Disposition: A | Discharge status: Home / Self Care | Payer: Self-pay | Source: Ambulatory Visit | Attending: General Surgery

## 2015-10-08 ENCOUNTER — Ambulatory Visit (HOSPITAL_COMMUNITY): Admission: RE | Admit: 2015-10-08 | Payer: Self-pay | Source: Ambulatory Visit | Admitting: General Surgery

## 2015-10-08 ENCOUNTER — Encounter (HOSPITAL_COMMUNITY): Admission: RE | Payer: Self-pay | Source: Ambulatory Visit

## 2015-10-08 DIAGNOSIS — F17210 Nicotine dependence, cigarettes, uncomplicated: Secondary | ICD-10-CM | POA: Insufficient documentation

## 2015-10-08 DIAGNOSIS — E785 Hyperlipidemia, unspecified: Secondary | ICD-10-CM | POA: Insufficient documentation

## 2015-10-08 DIAGNOSIS — I10 Essential (primary) hypertension: Secondary | ICD-10-CM | POA: Insufficient documentation

## 2015-10-08 DIAGNOSIS — Z79899 Other long term (current) drug therapy: Secondary | ICD-10-CM | POA: Insufficient documentation

## 2015-10-08 DIAGNOSIS — Z6841 Body Mass Index (BMI) 40.0 and over, adult: Secondary | ICD-10-CM | POA: Insufficient documentation

## 2015-10-08 DIAGNOSIS — N611 Abscess of the breast and nipple: Secondary | ICD-10-CM | POA: Insufficient documentation

## 2015-10-08 HISTORY — PX: INCISION AND DRAINAGE ABSCESS: SHX5864

## 2015-10-08 HISTORY — DX: Abscess of the breast and nipple: N61.1

## 2015-10-08 SURGERY — INCISION AND DRAINAGE, ABSCESS
Anesthesia: General | Site: Breast | Laterality: Right

## 2015-10-08 MED ORDER — HYDROMORPHONE HCL 1 MG/ML IJ SOLN
INTRAMUSCULAR | Status: AC
  Filled 2015-10-08: qty 1

## 2015-10-08 MED ORDER — LIDOCAINE HCL (CARDIAC) 20 MG/ML IV SOLN
INTRAVENOUS | Status: AC
  Filled 2015-10-08: qty 5

## 2015-10-08 MED ORDER — BUPIVACAINE-EPINEPHRINE 0.25% -1:200000 IJ SOLN
INTRAMUSCULAR | Status: DC | PRN
  Administered 2015-10-08: 20 mL

## 2015-10-08 MED ORDER — MIDAZOLAM HCL 5 MG/5ML IJ SOLN
INTRAMUSCULAR | Status: DC | PRN
  Administered 2015-10-08: 2 mg via INTRAVENOUS

## 2015-10-08 MED ORDER — FENTANYL CITRATE (PF) 100 MCG/2ML IJ SOLN
INTRAMUSCULAR | Status: AC
  Filled 2015-10-08: qty 2

## 2015-10-08 MED ORDER — MIDAZOLAM HCL 2 MG/2ML IJ SOLN
INTRAMUSCULAR | Status: AC
  Filled 2015-10-08: qty 2

## 2015-10-08 MED ORDER — HYDROMORPHONE HCL 1 MG/ML IJ SOLN
0.2500 mg | INTRAMUSCULAR | Status: DC | PRN
  Administered 2015-10-08 (×4): 0.5 mg via INTRAVENOUS

## 2015-10-08 MED ORDER — DEXAMETHASONE SODIUM PHOSPHATE 10 MG/ML IJ SOLN
INTRAMUSCULAR | Status: AC
  Filled 2015-10-08: qty 1

## 2015-10-08 MED ORDER — 0.9 % SODIUM CHLORIDE (POUR BTL) OPTIME
TOPICAL | Status: DC | PRN
  Administered 2015-10-08: 1000 mL

## 2015-10-08 MED ORDER — MEPERIDINE HCL 50 MG/ML IJ SOLN: 6.2500 mg | INTRAMUSCULAR | Status: DC | PRN

## 2015-10-08 MED ORDER — DEXAMETHASONE SODIUM PHOSPHATE 10 MG/ML IJ SOLN
INTRAMUSCULAR | Status: DC | PRN
  Administered 2015-10-08: 10 mg via INTRAVENOUS

## 2015-10-08 MED ORDER — LACTATED RINGERS IV SOLN
INTRAVENOUS | Status: DC
  Administered 2015-10-08: 08:00:00 via INTRAVENOUS

## 2015-10-08 MED ORDER — VANCOMYCIN HCL IN DEXTROSE 1-5 GM/200ML-% IV SOLN
1000.0000 mg | INTRAVENOUS | Status: AC
  Administered 2015-10-08: 1000 mg via INTRAVENOUS
  Filled 2015-10-08: qty 200

## 2015-10-08 MED ORDER — CEFAZOLIN SODIUM-DEXTROSE 2-3 GM-% IV SOLR: 2.0000 g | INTRAVENOUS | Status: DC

## 2015-10-08 MED ORDER — FENTANYL CITRATE (PF) 100 MCG/2ML IJ SOLN
25.0000 ug | INTRAMUSCULAR | Status: DC | PRN
  Administered 2015-10-08: 25 ug via INTRAVENOUS
  Administered 2015-10-08: 50 ug via INTRAVENOUS

## 2015-10-08 MED ORDER — CEFAZOLIN SODIUM-DEXTROSE 2-3 GM-% IV SOLR
INTRAVENOUS | Status: AC
  Filled 2015-10-08: qty 50

## 2015-10-08 MED ORDER — PROMETHAZINE HCL 25 MG/ML IJ SOLN: 6.2500 mg | INTRAMUSCULAR | Status: DC | PRN

## 2015-10-08 MED ORDER — PROPOFOL 10 MG/ML IV BOLUS
INTRAVENOUS | Status: DC | PRN
  Administered 2015-10-08: 200 mg via INTRAVENOUS

## 2015-10-08 MED ORDER — OXYCODONE-ACETAMINOPHEN 5-325 MG PO TABS
1.0000 | ORAL_TABLET | Freq: Once | ORAL | Status: AC
  Administered 2015-10-08: 1 via ORAL
  Filled 2015-10-08: qty 1

## 2015-10-08 MED ORDER — PROPOFOL 10 MG/ML IV BOLUS
INTRAVENOUS | Status: AC
  Filled 2015-10-08: qty 20

## 2015-10-08 MED ORDER — BUPIVACAINE-EPINEPHRINE (PF) 0.25% -1:200000 IJ SOLN
INTRAMUSCULAR | Status: AC
  Filled 2015-10-08: qty 30

## 2015-10-08 MED ORDER — FENTANYL CITRATE (PF) 100 MCG/2ML IJ SOLN
INTRAMUSCULAR | Status: DC | PRN
  Administered 2015-10-08 (×2): 50 ug via INTRAVENOUS

## 2015-10-08 MED ORDER — ONDANSETRON HCL 4 MG/2ML IJ SOLN
INTRAMUSCULAR | Status: DC | PRN
  Administered 2015-10-08: 4 mg via INTRAVENOUS

## 2015-10-08 MED ORDER — LIDOCAINE HCL (CARDIAC) 20 MG/ML IV SOLN
INTRAVENOUS | Status: DC | PRN
  Administered 2015-10-08: 100 mg via INTRAVENOUS

## 2015-10-08 MED ORDER — ONDANSETRON HCL 4 MG/2ML IJ SOLN
INTRAMUSCULAR | Status: AC
  Filled 2015-10-08: qty 2

## 2015-10-08 SURGICAL SUPPLY — 37 items
BENZOIN TINCTURE PRP APPL 2/3 (GAUZE/BANDAGES/DRESSINGS) IMPLANT
BLADE HEX COATED 2.75 (ELECTRODE) IMPLANT
BLADE SURG 15 STRL LF DISP TIS (BLADE) IMPLANT
BLADE SURG 15 STRL SS (BLADE)
BLADE SURG SZ10 CARB STEEL (BLADE) IMPLANT
CHLORAPREP W/TINT 26ML (MISCELLANEOUS) ×3 IMPLANT
CLOSURE WOUND 1/2 X4 (GAUZE/BANDAGES/DRESSINGS)
COVER SURGICAL LIGHT HANDLE (MISCELLANEOUS) IMPLANT
DECANTER SPIKE VIAL GLASS SM (MISCELLANEOUS) IMPLANT
DRAIN PENROSE 18X1/4 LTX STRL (WOUND CARE) ×3 IMPLANT
DRAPE LAPAROTOMY TRNSV 102X78 (DRAPE) ×3 IMPLANT
ELECT PENCIL ROCKER SW 15FT (MISCELLANEOUS) IMPLANT
ELECT REM PT RETURN 9FT ADLT (ELECTROSURGICAL) ×3
ELECTRODE REM PT RTRN 9FT ADLT (ELECTROSURGICAL) ×1 IMPLANT
GAUZE SPONGE 4X4 12PLY STRL (GAUZE/BANDAGES/DRESSINGS) ×3 IMPLANT
GAUZE SPONGE 4X4 16PLY XRAY LF (GAUZE/BANDAGES/DRESSINGS) IMPLANT
GLOVE BIOGEL M 8.0 STRL (GLOVE) ×3 IMPLANT
GLOVE BIOGEL PI IND STRL 7.0 (GLOVE) ×1 IMPLANT
GLOVE BIOGEL PI INDICATOR 7.0 (GLOVE) ×2
GOWN SPEC L4 XLG W/TWL (GOWN DISPOSABLE) IMPLANT
GOWN STRL REUS W/TWL LRG LVL3 (GOWN DISPOSABLE) IMPLANT
GOWN STRL REUS W/TWL XL LVL3 (GOWN DISPOSABLE) ×6 IMPLANT
KIT BASIN OR (CUSTOM PROCEDURE TRAY) ×3 IMPLANT
MARKER SKIN DUAL TIP RULER LAB (MISCELLANEOUS) IMPLANT
NEEDLE HYPO 22GX1.5 SAFETY (NEEDLE) ×3 IMPLANT
NEEDLE HYPO 25X1 1.5 SAFETY (NEEDLE) IMPLANT
NS IRRIG 1000ML POUR BTL (IV SOLUTION) ×3 IMPLANT
PACK BASIC VI WITH GOWN DISP (CUSTOM PROCEDURE TRAY) IMPLANT
PACK GENERAL/GYN (CUSTOM PROCEDURE TRAY) ×3 IMPLANT
SOL PREP POV-IOD 4OZ 10% (MISCELLANEOUS) IMPLANT
STRIP CLOSURE SKIN 1/2X4 (GAUZE/BANDAGES/DRESSINGS) IMPLANT
SUT VIC AB 4-0 SH 18 (SUTURE) IMPLANT
SWAB COLLECTION DEVICE MRSA (MISCELLANEOUS) ×3 IMPLANT
SYR CONTROL 10ML LL (SYRINGE) IMPLANT
TAPE CLOTH SURG 6X10 WHT LF (GAUZE/BANDAGES/DRESSINGS) ×3 IMPLANT
WATER STERILE IRR 1500ML POUR (IV SOLUTION) IMPLANT
YANKAUER SUCT BULB TIP 10FT TU (MISCELLANEOUS) IMPLANT

## 2015-10-08 NOTE — Transfer of Care (Signed)
Immediate Anesthesia Transfer of Care Note  Patient: Rachael Calhoun  Procedure(s) Performed: Procedure(s): INCISION AND DRAINAGE RIGHT BREAST ABSCESS (Right)  Patient Location: PACU  Anesthesia Type:General  Level of Consciousness: awake and alert   Airway & Oxygen Therapy: Patient Spontanous Breathing and Patient connected to face mask oxygen  Post-op Assessment: Report given to RN and Post -op Vital signs reviewed and stable  Post vital signs: Reviewed and stable  Last Vitals:  Filed Vitals:   10/08/15 0652  BP: 137/88  Pulse: 70  Temp: 36.7 C  Resp: 16    Complications: No apparent anesthesia complications

## 2015-10-08 NOTE — Op Note (Signed)
Preoperative diagnosis: right breast abscess  Postoperative diagnosis: right breast abscess   Procedure: incision and drainage of right breast abscess. Focused ultrasound of right breast  Surgeon: Feliciana Rossetti, M.D.  Asst: none  Anesthesia: General  Indications for procedure: Rachael Calhoun is a 50 y.o. year old female with symptoms of swelling and pain at right breast for over 1 month. She had 2 previous office incision and drainage procedures but continued to have recurrence despite antibiotics.  Description of procedure: The patient was brought into the operative suite. Anesthesia was administered with General LMA anesthesia. WHO checklist was applied. The patient was then placed in supine. The area was prepped and draped in the usual sterile fashion.  Ultrasound was used to visualized the cavity which appeared to be about 1.2cm deep with width of 4cm. Next 0.25% marcaine w epi was infiltrated in the area. A T incision was made medial to the areola. On incision a small amount of pus was expressed and sent for culture. The cavity was probed and appeared to travel under the areolar complex toward the lateral aspect of the breast. A counter incision was made at this lateral area and a 1/4" penrose was looped through the cavity and tied to itself with a 2-0 silk. Next the area was irrigated and a single 4x4 salinated gauze was inserted into the cavity. More local anesthesia was infiltrated into the area and a dressing was put in place.  Findings: 1.2x4cm abscess  Specimen: breast abscess culture  Implant: 1/4" penrose drain and 4x4 gauze packing  Blood loss: <50cc  Local anesthesia: 20 ml 0.25% marcaine with epinephrine  Complications: none  Feliciana Rossetti, M.D. General, Bariatric, & Minimally Invasive Surgery [facility]

## 2015-10-08 NOTE — H&P (Signed)
Rachael Calhoun is an 50 y.o. female.   Chief Complaint: breast pain HPI: 50 yo female with 1 month history of right breast abscess. She has had 2 previous drainage procedures last being 2/10. Both resulting in pain without improvement. She continues to have purulent drainage today. She has not had fevers over the weekend. She smokes 0.5-1ppd.   Past Medical History  Diagnosis Date  . Hypertension   . Hyperlipidemia   . Hypertension   . Breast abscess     rt breast    Past Surgical History  Procedure Laterality Date  . Cyst removed from l breast    . Surgery on l wrist as a child    . Tubal ligation      Family History  Problem Relation Age of Onset  . Diabetes Mother   . Cancer Mother   . Heart failure Father   . Hypertension Father    Social History:  reports that she has been smoking Cigarettes.  She has been smoking about 0.50 packs per day. She has never used smokeless tobacco. She reports that she does not drink alcohol or use illicit drugs.  Allergies: No Known Allergies  Medications Prior to Admission  Medication Sig Dispense Refill  . acetaminophen (TYLENOL) 500 MG tablet Take 1,000 mg by mouth every 6 (six) hours as needed for moderate pain or headache.    . bisacodyl (BISACODYL) 5 MG EC tablet Take 10 mg by mouth daily as needed for moderate constipation.    . hydrochlorothiazide (HYDRODIURIL) 25 MG tablet Take 1 tablet (25 mg total) by mouth daily. 30 tablet 1  . naproxen (NAPROSYN) 500 MG tablet Take 1 tablet (500 mg total) by mouth 2 (two) times daily as needed for mild pain, moderate pain or headache (TAKE WITH MEALS.). 20 tablet 0  . oxyCODONE-acetaminophen (PERCOCET) 5-325 MG tablet Take 1 tablet by mouth every 6 (six) hours as needed for severe pain. 20 tablet 0  . sulfamethoxazole-trimethoprim (BACTRIM DS,SEPTRA DS) 800-160 MG tablet Take 1 tablet by mouth 2 (two) times daily. 14 tablet 0  . ondansetron (ZOFRAN) 8 MG tablet Take 1 tablet (8 mg total) by mouth  every 8 (eight) hours as needed for nausea or vomiting. 10 tablet 0    No results found for this or any previous visit (from the past 48 hour(s)). No results found.  Review of Systems  Constitutional: Positive for chills. Negative for fever.  HENT: Negative for hearing loss.   Eyes: Negative for blurred vision and double vision.  Respiratory: Negative for cough and hemoptysis.   Cardiovascular: Negative for chest pain and palpitations.  Gastrointestinal: Negative for nausea, vomiting and abdominal pain.  Genitourinary: Negative for dysuria and urgency.  Musculoskeletal: Negative for myalgias and neck pain.  Skin: Negative for itching and rash.       Right breast swelling  Neurological: Negative for dizziness, tingling and headaches.  Endo/Heme/Allergies: Does not bruise/bleed easily.  Psychiatric/Behavioral: Negative for depression and suicidal ideas.    Blood pressure 137/88, pulse 70, temperature 98.1 F (36.7 C), temperature source Oral, resp. rate 16, height 4' (1.219 m), weight 88.622 kg (195 lb 6 oz), last menstrual period 09/23/2015, SpO2 99 %. Physical Exam  Vitals reviewed. Constitutional: She is oriented to person, place, and time. She appears well-developed and well-nourished.  HENT:  Head: Normocephalic and atraumatic.  Eyes: Conjunctivae and EOM are normal. Pupils are equal, round, and reactive to light.  Neck: Normal range of motion. Neck supple.  Cardiovascular: Normal  rate and regular rhythm.   Respiratory: Effort normal and breath sounds normal.  GI: Soft. Bowel sounds are normal. She exhibits no distension. There is no tenderness.  Musculoskeletal: Normal range of motion.  Neurological: She is alert and oriented to person, place, and time.  Skin: Skin is warm and dry.  purulent drainage from right breast peri-areolar  Psychiatric: She has a normal mood and affect. Her behavior is normal.     Assessment/Plan 50 yo female with right breast abscess -I+D today  with anesthesia  Rodman Pickle, MD 10/08/2015, 7:43 AM

## 2015-10-08 NOTE — Discharge Instructions (Signed)
General Anesthesia, Adult, Care After °Refer to this sheet in the next few weeks. These instructions provide you with information on caring for yourself after your procedure. Your health care provider may also give you more specific instructions. Your treatment has been planned according to current medical practices, but problems sometimes occur. Call your health care provider if you have any problems or questions after your procedure. °WHAT TO EXPECT AFTER THE PROCEDURE °After the procedure, it is typical to experience: °· Sleepiness. °· Nausea and vomiting. °HOME CARE INSTRUCTIONS °· For the first 24 hours after general anesthesia: °¨ Have a responsible person with you. °¨ Do not drive a car. If you are alone, do not take public transportation. °¨ Do not drink alcohol. °¨ Do not take medicine that has not been prescribed by your health care provider. °¨ Do not sign important papers or make important decisions. °¨ You may resume a normal diet and activities as directed by your health care provider. °· If you have questions or problems that seem related to general anesthesia, call the hospital and ask for the anesthetist or anesthesiologist on call. °SEEK MEDICAL CARE IF: °· You have nausea and vomiting that continue the day after anesthesia. °· You develop a rash. °SEEK IMMEDIATE MEDICAL CARE IF:  °· You have difficulty breathing. °· You have chest pain. °· You have any allergic problems. °  °This information is not intended to replace advice given to you by your health care provider. Make sure you discuss any questions you have with your health care provider. °  °Document Released: 11/17/2000 Document Revised: 09/01/2014 Document Reviewed: 12/10/2011 °Elsevier Interactive Patient Education ©2016 [employer] ° °

## 2015-10-08 NOTE — Anesthesia Preprocedure Evaluation (Addendum)
Anesthesia Evaluation  Patient identified by MRN, date of birth, ID band Patient awake    Reviewed: Allergy & Precautions, NPO status , Patient's Chart, lab work & pertinent test results  Airway Mallampati: III  TM Distance: >3 FB Neck ROM: Full    Dental  (+) Poor Dentition, Missing   Pulmonary Current Smoker,    Pulmonary exam normal breath sounds clear to auscultation       Cardiovascular hypertension, Pt. on medications Normal cardiovascular exam Rhythm:Regular Rate:Normal     Neuro/Psych  Headaches, PSYCHIATRIC DISORDERS Depression    GI/Hepatic negative GI ROS, Neg liver ROS,   Endo/Other  Morbid obesityRight Breast abscess Hyperlipidemia  Renal/GU negative Renal ROS  negative genitourinary   Musculoskeletal negative musculoskeletal ROS (+)   Abdominal (+) + obese,   Peds  Hematology   Anesthesia Other Findings   Reproductive/Obstetrics HSV                          Anesthesia Physical Anesthesia Plan  ASA: III  Anesthesia Plan: General   Post-op Pain Management:    Induction: Intravenous  Airway Management Planned: LMA  Additional Equipment:   Intra-op Plan:   Post-operative Plan: Extubation in OR  Informed Consent: I have reviewed the patients History and Physical, chart, labs and discussed the procedure including the risks, benefits and alternatives for the proposed anesthesia with the patient or authorized representative who has indicated his/her understanding and acceptance.   Dental advisory given  Plan Discussed with: CRNA, Anesthesiologist and Surgeon  Anesthesia Plan Comments:         Anesthesia Quick Evaluation

## 2015-10-08 NOTE — Progress Notes (Signed)
Patient is teary eyed as she and the daughter have a Engineer, petroleum in the room. I asked daughter to leave and she refused but she  Did calm down.

## 2015-10-08 NOTE — Anesthesia Procedure Notes (Signed)
Procedure Name: LMA Insertion Date/Time: 10/08/2015 9:13 AM Performed by: Leroy Libman L Patient Re-evaluated:Patient Re-evaluated prior to inductionOxygen Delivery Method: Circle system utilized Preoxygenation: Pre-oxygenation with 100% oxygen Intubation Type: IV induction Ventilation: Mask ventilation without difficulty LMA: LMA inserted LMA Size: 4.0 Number of attempts: 1 Placement Confirmation: breath sounds checked- equal and bilateral and positive ETCO2 Tube secured with: Tape Dental Injury: Teeth and Oropharynx as per pre-operative assessment

## 2015-10-08 NOTE — Anesthesia Postprocedure Evaluation (Signed)
Anesthesia Post Note  Patient: Rachael Calhoun  Procedure(s) Performed: Procedure(s) (LRB): INCISION AND DRAINAGE RIGHT BREAST ABSCESS (Right)  Patient location during evaluation: PACU Anesthesia Type: General Level of consciousness: awake and alert and oriented Pain management: pain level controlled Vital Signs Assessment: post-procedure vital signs reviewed and stable Respiratory status: spontaneous breathing, nonlabored ventilation and respiratory function stable Cardiovascular status: blood pressure returned to baseline and stable Postop Assessment: no signs of nausea or vomiting Anesthetic complications: no    Last Vitals:  Filed Vitals:   10/08/15 1015 10/08/15 1030  BP: 136/91 119/75  Pulse: 72 79  Temp:    Resp: 20 20    Last Pain:  Filed Vitals:   10/08/15 1038  PainSc: 7                  Elfego Giammarino A.

## 2015-10-13 LAB — WOUND CULTURE

## 2015-10-14 LAB — ANAEROBIC CULTURE

## 2016-01-14 ENCOUNTER — Emergency Department (HOSPITAL_COMMUNITY)
Admission: EM | Admit: 2016-01-14 | Discharge: 2016-01-14 | Disposition: A | Discharge status: Home / Self Care | Payer: Self-pay | Attending: Emergency Medicine | Admitting: Emergency Medicine

## 2016-01-14 ENCOUNTER — Encounter (HOSPITAL_COMMUNITY): Payer: Self-pay | Admitting: *Deleted

## 2016-01-14 DIAGNOSIS — F17210 Nicotine dependence, cigarettes, uncomplicated: Secondary | ICD-10-CM | POA: Insufficient documentation

## 2016-01-14 DIAGNOSIS — R600 Localized edema: Secondary | ICD-10-CM | POA: Insufficient documentation

## 2016-01-14 DIAGNOSIS — Z8639 Personal history of other endocrine, nutritional and metabolic disease: Secondary | ICD-10-CM | POA: Insufficient documentation

## 2016-01-14 DIAGNOSIS — B372 Candidiasis of skin and nail: Secondary | ICD-10-CM | POA: Insufficient documentation

## 2016-01-14 DIAGNOSIS — Z8742 Personal history of other diseases of the female genital tract: Secondary | ICD-10-CM | POA: Insufficient documentation

## 2016-01-14 DIAGNOSIS — I10 Essential (primary) hypertension: Secondary | ICD-10-CM | POA: Insufficient documentation

## 2016-01-14 DIAGNOSIS — R609 Edema, unspecified: Secondary | ICD-10-CM

## 2016-01-14 LAB — CBG MONITORING, ED: Glucose-Capillary: 89 mg/dL (ref 65–99)

## 2016-01-14 MED ORDER — NYSTATIN 100000 UNIT/GM EX CREA: TOPICAL_CREAM | CUTANEOUS | Status: DC

## 2016-01-14 MED ORDER — HYDROCHLOROTHIAZIDE 25 MG PO TABS
25.0000 mg | ORAL_TABLET | Freq: Once | ORAL | Status: AC
  Administered 2016-01-14: 25 mg via ORAL
  Filled 2016-01-14: qty 1

## 2016-01-14 MED ORDER — HYDROCHLOROTHIAZIDE 25 MG PO TABS
25.0000 mg | ORAL_TABLET | Freq: Every day | ORAL | Status: DC
Start: 2016-01-14 — End: 2017-04-17

## 2016-01-14 NOTE — ED Notes (Signed)
Pt c/o swelling in her hands and feet since she stopped taking her blood pressure meds about 1 month ago because they were making her urinate too many times while at work. She also c/o rash in between her breast and perineal area.

## 2016-01-14 NOTE — ED Provider Notes (Signed)
CSN: [id number]     Arrival date & time 01/14/16  0036 History   First MD Initiated Contact with Patient 01/14/16 0408     Chief Complaint  Patient presents with  . Rash     (Consider location/radiation/quality/duration/timing/severity/associated sxs/prior Treatment) The history is provided by the patient and medical records. No language interpreter was used.     Rachael Calhoun is a 50 y.o. female  with a hx of HTN, hyperlipidemia presents to the Emergency Department complaining of gradual, persistent, progressively worsening rash onset 5 days ago. Patient reports it is itching and burning. She reports that the rash is located between and under her bilateral breasts and in her flexural folds of her groin. She reports that due to some incontinence that she is wearing a feminine pad every day. She reports she only changes this one times per day so it is often wet. Patient has a history of diabetes. She denies fever, chills, nausea, vomiting, abdominal pain.  Patient denies new detergent, lotion, clothing or other environmental changes.  Patient also reports worsening swelling to her hands and feet being approximately one month ago when she stopped taking her blood pressure medications. Patient reports her diuretic caused her to urinate and her job would not let her use the bathroom often enough.  She reports she's been unable to follow-up with her primary care physician because he requires upfront payment and she does not have the money. She denies chest pain, shortness of breath, urine changes.  Past Medical History  Diagnosis Date  . Hypertension   . Hyperlipidemia   . Hypertension   . Breast abscess     rt breast   Past Surgical History  Procedure Laterality Date  . Cyst removed from l breast    . Surgery on l wrist as a child    . Tubal ligation    . Incision and drainage abscess Right 10/08/2015    Procedure: INCISION AND DRAINAGE RIGHT BREAST ABSCESS;  Surgeon: De BlanchLuke Aaron Kinsinger,  MD;  Location: [facility];  Service: General;  Laterality: Right;   Family History  Problem Relation Age of Onset  . Diabetes Mother   . Cancer Mother   . Heart failure Father   . Hypertension Father    Social History  Substance Use Topics  . Smoking status: Current Some Day Smoker -- 0.50 packs/day    Types: Cigarettes    Last Attempt to Quit: 09/19/2011  . Smokeless tobacco: Never Used  . Alcohol Use: No   OB History    No data available     Review of Systems  Constitutional: Negative for fever, diaphoresis, appetite change, fatigue and unexpected weight change.  HENT: Negative for mouth sores.   Eyes: Negative for visual disturbance.  Respiratory: Negative for cough, chest tightness, shortness of breath and wheezing.   Cardiovascular: Positive for leg swelling. Negative for chest pain.  Gastrointestinal: Negative for nausea, vomiting, abdominal pain, diarrhea and constipation.  Endocrine: Negative for polydipsia, polyphagia and polyuria.  Genitourinary: Negative for dysuria, urgency, frequency and hematuria.  Musculoskeletal: Negative for back pain and neck stiffness.  Skin: Positive for rash.  Allergic/Immunologic: Negative for immunocompromised state.  Neurological: Negative for syncope, light-headedness and headaches.  Hematological: Does not bruise/bleed easily.  Psychiatric/Behavioral: Negative for sleep disturbance. The patient is not nervous/anxious.       Allergies  Review of patient's allergies indicates no known allergies.  Home Medications   Prior to Admission medications   Medication Sig Start Date End  Date Taking? Authorizing Provider  hydrochlorothiazide (HYDRODIURIL) 25 MG tablet Take 1 tablet (25 mg total) by mouth daily. 01/14/16   Tadhg Eskew, PA-C  nystatin cream (MYCOSTATIN) Apply to affected area 2 times daily 01/14/16   Dahlia Client Jassiel Flye, PA-C   BP 157/101 mmHg  Pulse 64  Temp(Src) 98.3 F (36.8 C) (Oral)  Resp 16  Ht  (1.473  m)  Wt 87.091 kg  BMI 40.14 kg/m2  SpO2 94%  LMP 01/07/2016 Physical Exam  Constitutional: She appears well-developed and well-nourished. No distress.  Awake, alert, nontoxic appearance  HENT:  Head: Normocephalic and atraumatic.  Mouth/Throat: Oropharynx is clear and moist. No oropharyngeal exudate.  Eyes: Conjunctivae are normal. No scleral icterus.  Neck: Normal range of motion. Neck supple.  Cardiovascular: Normal rate, regular rhythm, normal heart sounds and intact distal pulses.   No murmur heard. Pulmonary/Chest: Effort normal and breath sounds normal. No respiratory distress. She has no wheezes.  Equal chest expansion  Abdominal: Soft. Bowel sounds are normal. She exhibits no mass. There is no tenderness. There is no rebound and no guarding.  Musculoskeletal: Normal range of motion. She exhibits edema.  Mild, nonpitting edema of the bilateral lower legs No swelling or edema of the hands or arms  Neurological: She is alert.  Speech is clear and goal oriented Moves extremities without ataxia  Skin: Skin is warm and dry. Rash noted. She is not diaphoretic.  Beefy red rash with satellite lesions noted under the bilateral breast and in the flexural folds of the groin; no open lesions, no vesicles, no induration or pustules.    Psychiatric: She has a normal mood and affect.  Nursing note and vitals reviewed.   ED Course  Procedures (including critical care time) Labs Review Labs Reviewed  CBG MONITORING, ED    MDM   Final diagnoses:  Candidal skin infection  Peripheral edema  Essential hypertension   Adalea Handler presents With rash consistent with Candida. Patient has history of diabetes. Normal blood glucose today.  No evidence of secondary infection. Will give nystatin cream.  Patient also with complaints of swelling in her hands and feet after stopping her blood pressure medications. No pitting edema today. Clear and equal breath sounds. No shortness of breath or  chest pain. Patient's medication refilled. Recommend follow-up with primary care physician for discussion of alternative medications.    Dahlia Client Lester Crickenberger, PA-C 01/14/16 0741  Layla Maw Ward, DO 01/17/16 2141

## 2016-01-14 NOTE — Discharge Instructions (Signed)
1. Medications: nystatin cream, HCTZ, usual home medications 2. Treatment: rest, drink plenty of fluids, change feminine pad more often than 1x per day 3. Follow Up: Please followup with your primary doctor in 5-7 days for discussion of your diagnoses and further evaluation after today's visit; if you do not have a primary care doctor use the resource guide provided to find one; Please return to the ER for worsening symptoms, fever, abd pain, worsening rash or other concerns   Cutaneous Candidiasis Cutaneous candidiasis is a condition in which there is an overgrowth of yeast (candida) on the skin. Yeast normally live on the skin, but in small enough numbers not to cause any symptoms. In certain cases, increased growth of the yeast may cause an actual yeast infection. This kind of infection usually occurs in areas of the skin that are constantly warm and moist, such as the armpits or the groin. Yeast is the most common cause of diaper rash in babies and in people who cannot control their bowel movements (incontinence). CAUSES  The fungus that most often causes cutaneous candidiasis is Candida albicans. Conditions that can increase the risk of getting a yeast infection of the skin include:  Obesity.  Pregnancy.  Diabetes.  Taking antibiotic medicine.  Taking birth control pills.  Taking steroid medicines.  Thyroid disease.  An iron or zinc deficiency.  Problems with the immune system. SYMPTOMS   Red, swollen area of the skin.  Bumps on the skin.  Itchiness. DIAGNOSIS  The diagnosis of cutaneous candidiasis is usually based on its appearance. Light scrapings of the skin may also be taken and viewed under a microscope to identify the presence of yeast. TREATMENT  Antifungal creams may be applied to the infected skin. In severe cases, oral medicines may be needed.  HOME CARE INSTRUCTIONS   Keep your skin clean and dry.  Maintain a healthy weight.  If you have diabetes, keep  your blood sugar under control. SEEK IMMEDIATE MEDICAL CARE IF:  Your rash continues to spread despite treatment.  You have a fever, chills, or abdominal pain.   This information is not intended to replace advice given to you by your health care provider. Make sure you discuss any questions you have with your health care provider.   Document Released: 04/29/2011 Document Revised: 11/03/2011 Document Reviewed: 02/12/2015 Elsevier Interactive Patient Education [employer]2016 Elsevier Inc.

## 2016-05-16 ENCOUNTER — Encounter (HOSPITAL_COMMUNITY): Payer: Self-pay | Admitting: Emergency Medicine

## 2016-05-16 ENCOUNTER — Emergency Department (HOSPITAL_COMMUNITY)
Admission: EM | Admit: 2016-05-16 | Discharge: 2016-05-17 | Disposition: A | Discharge status: Home / Self Care | Payer: Self-pay | Attending: Emergency Medicine | Admitting: Emergency Medicine

## 2016-05-16 DIAGNOSIS — F17210 Nicotine dependence, cigarettes, uncomplicated: Secondary | ICD-10-CM | POA: Insufficient documentation

## 2016-05-16 DIAGNOSIS — M25561 Pain in right knee: Secondary | ICD-10-CM | POA: Insufficient documentation

## 2016-05-16 DIAGNOSIS — I10 Essential (primary) hypertension: Secondary | ICD-10-CM | POA: Insufficient documentation

## 2016-05-16 DIAGNOSIS — M25512 Pain in left shoulder: Secondary | ICD-10-CM | POA: Insufficient documentation

## 2016-05-16 NOTE — ED Triage Notes (Signed)
Patient arrives with complaint of left arm pain and right knee pain. Explains that one week ago she began to have neck pain which radiates down her left arm; and that 5 days ago she began to have aching pain in right knee. Today while at work her knee "gave out". Since that time pain has been much worse in that joint.

## 2016-05-17 ENCOUNTER — Emergency Department (HOSPITAL_COMMUNITY): Payer: Self-pay

## 2016-05-17 MED ORDER — DICLOFENAC SODIUM ER 100 MG PO TB24: 100.0000 mg | ORAL_TABLET | Freq: Every day | ORAL | 1 refills | Status: DC

## 2016-05-17 MED ORDER — KETOROLAC TROMETHAMINE 60 MG/2ML IM SOLN
60.0000 mg | Freq: Once | INTRAMUSCULAR | Status: AC
  Administered 2016-05-17: 60 mg via INTRAMUSCULAR
  Filled 2016-05-17: qty 2

## 2016-05-17 NOTE — ED Notes (Signed)
Pt back from x-ray.

## 2016-05-17 NOTE — ED Provider Notes (Signed)
[facility]-EMERGENCY DEPT Provider Note   CSN: [id number] Arrival date & time: 05/16/16  2313     History   Chief Complaint Chief Complaint  Patient presents with  . Knee Pain  . Arm Pain    HPI Rachael Calhoun is a 50 y.o. female.  50 yo F with PMH of HTN, presenting to ED with chief complaint of right knee pain since Monday and left shoulder pain x 1 week. Knee pain has been progressively worsening, described as throbbing, and "gave out" while she was working this evening. She has tried both hold and cold compresses, ibuprofen, and tylenol, without relief of pain. Her shoulder pain is worse with overhead movements, and she described shooting pains that travel down her arm. Denies recent fever, neck pain, chest pain, palpitations, shortness of breath. No cardiac hx. Denies any recent or past trauma, hx of arthritis, or orthopedic problems.      Past Medical History:  Diagnosis Date  . Breast abscess    rt breast  . Hyperlipidemia   . Hypertension   . Hypertension     Patient Active Problem List   Diagnosis Date Noted  . HERPES LABIALIS 02/22/2009  . TOBACCO ABUSE 02/22/2009  . BACK PAIN, LUMBAR 02/22/2009  . ABDOMINAL PAIN 09/18/2008  . ANXIETY DEPRESSION 07/02/2007  . HYPERCHOLESTEROLEMIA 04/15/2007  . MIGRAINE HEADACHE 04/15/2007  . HYPERTENSION, BENIGN ESSENTIAL 04/15/2007    Past Surgical History:  Procedure Laterality Date  . Cyst removed from L breast    . INCISION AND DRAINAGE ABSCESS Right 10/08/2015   Procedure: INCISION AND DRAINAGE RIGHT BREAST ABSCESS;  Surgeon: De Blanch Kinsinger, MD;  Location: [facility];  Service: General;  Laterality: Right;  . Surgery on L wrist as a child    . TUBAL LIGATION      OB History    No data available       Home Medications    Prior to Admission medications   Medication Sig Start Date End Date Taking? Authorizing Provider  hydrochlorothiazide (HYDRODIURIL) 25 MG tablet Take 1 tablet (25 mg total) by mouth daily.  01/14/16   Hannah Muthersbaugh, PA-C  nystatin cream (MYCOSTATIN) Apply to affected area 2 times daily 01/14/16   Dahlia Client Muthersbaugh, PA-C    Family History Family History  Problem Relation Age of Onset  . Diabetes Mother   . Cancer Mother   . Heart failure Father   . Hypertension Father     Social History Social History  Substance Use Topics  . Smoking status: Current Some Day Smoker    Packs/day: 0.50    Types: Cigarettes    Last attempt to quit: 09/19/2011  . Smokeless tobacco: Never Used  . Alcohol use No     Allergies   Review of patient's allergies indicates no known allergies.   Review of Systems Review of Systems  Constitutional: Negative for chills, diaphoresis and fever.  Respiratory: Negative for chest tightness and shortness of breath.   Cardiovascular: Negative for chest pain and palpitations.  Musculoskeletal: Negative for neck pain and neck stiffness.  Skin: Negative for rash.  Neurological: Positive for numbness (Numbness in left arm with shooting pains). Negative for dizziness, syncope, weakness and headaches.  All other systems reviewed and are negative.    Physical Exam Updated Vital Signs BP (!) 166/113   Pulse 102   Temp 98.3 F (36.8 C) (Oral)   Resp 20   SpO2 100%   Physical Exam  Constitutional:  Obese female, lying in stretcher, appears  to be in pain but no distress  HENT:  Head: Normocephalic and atraumatic.  Neck: Normal range of motion. Neck supple.  Cardiovascular: Normal rate, normal heart sounds and intact distal pulses.   Pulmonary/Chest: Effort normal and breath sounds normal. No respiratory distress.  Musculoskeletal:       Right shoulder: Normal.       Left shoulder: She exhibits tenderness (Pain reproducible with palpation of shoulder). She exhibits normal range of motion (Full ROM but pain elicited with movement, especially overhead movements), no bony tenderness, no swelling, no effusion and no crepitus.       Right  knee: She exhibits decreased range of motion (Unable to assess full ROM due to pain). She exhibits no swelling, no effusion, no ecchymosis and no deformity. Tenderness (TTP posterior aspect of knee, but no swelling or cyst appreciated) found. Medial joint line and lateral joint line tenderness noted.       Left knee: Normal.  Neurological: She is alert. She has normal strength. No sensory deficit. She exhibits normal muscle tone.  Skin: Skin is warm and dry.  Psychiatric: She has a normal mood and affect.  Nursing note and vitals reviewed.    ED Treatments / Results  Labs (all labs ordered are listed, but only abnormal results are displayed) Labs Reviewed - No data to display  EKG  EKG Interpretation None       Radiology No results found.  Procedures Procedures (including critical care time)  Medications Ordered in ED Medications - No data to display   Initial Impression / Assessment and Plan / ED Course  I have reviewed the triage vital signs and the nursing notes.  Pertinent labs & imaging results that were available during my care of the patient were reviewed by me and considered in my medical decision making (see chart for details).  Clinical Course   X-rays of right knee and left shoulder, and 60 mg Toradol IM given for pain.   X-ray of right knee shows early osteoarthritis. Normal X-ray of shoulder. Shoulder pain is reproducible with palpation and overhead ROM, suggesting possible impingement. Patient ambulates without difficulty. Will provide referral to orthopedics, prescription Voltaren for pain relief, and note for work. Patient agrees with plan and stable for d/c home.  Final Clinical Impressions(s) / ED Diagnoses   Final diagnoses:  None    New Prescriptions New Prescriptions   No medications on file     Jari PiggDaryl F de Villier II, [place]PA 05/17/16 0155    Pricilla LovelessScott Goldston, MD 05/21/16 1316

## 2016-05-17 NOTE — ED Notes (Signed)
Patient transported to X-ray 

## 2016-06-24 ENCOUNTER — Emergency Department (HOSPITAL_COMMUNITY)
Admission: EM | Admit: 2016-06-24 | Discharge: 2016-06-25 | Disposition: A | Discharge status: Home / Self Care | Payer: Self-pay | Attending: Emergency Medicine | Admitting: Emergency Medicine

## 2016-06-24 ENCOUNTER — Encounter (HOSPITAL_COMMUNITY): Payer: Self-pay

## 2016-06-24 DIAGNOSIS — L02212 Cutaneous abscess of back [any part, except buttock]: Secondary | ICD-10-CM | POA: Insufficient documentation

## 2016-06-24 DIAGNOSIS — I10 Essential (primary) hypertension: Secondary | ICD-10-CM | POA: Insufficient documentation

## 2016-06-24 DIAGNOSIS — F17210 Nicotine dependence, cigarettes, uncomplicated: Secondary | ICD-10-CM | POA: Insufficient documentation

## 2016-06-24 DIAGNOSIS — Z79899 Other long term (current) drug therapy: Secondary | ICD-10-CM | POA: Insufficient documentation

## 2016-06-24 MED ORDER — LIDOCAINE-EPINEPHRINE (PF) 2 %-1:200000 IJ SOLN
10.0000 mL | Freq: Once | INTRAMUSCULAR | Status: AC
  Administered 2016-06-25: 10 mL via INTRADERMAL
  Filled 2016-06-24: qty 20

## 2016-06-24 NOTE — ED Triage Notes (Signed)
Pt states that she has a red and swollen lump below her R shoulder. States that she had the same thing last year and was told it was a spiderbite. Denies N/V/D/F. Endorses general malaise. A&Ox4. Ambulatory.

## 2016-06-24 NOTE — ED Provider Notes (Signed)
[facility]ER[facility]PT Provider Note   CSN: [id number] Arrival date & time: 06/24/16  2139     History   Chief Complaint Chief Complaint  Patient presents with  . Mass    R side of back  . Fatigue    HPI Rachael Calhoun is a 50 y.o. female.  The history is provided by the patient.  Abscess  Location:  Torso Torso abscess location:  Upper back Size:  2 cm Abscess quality: induration, painful, redness and warmth   Duration:  5 days Progression:  Worsening Pain details:    Quality:  Aching and sharp   Severity:  Moderate   Timing:  Constant   Progression:  Worsening Chronicity:  Recurrent Context: not immunosuppression   Relieved by:  Nothing Worsened by:  Nothing Ineffective treatments:  None tried Associated symptoms: no fever and no vomiting   Risk factors: prior abscess     Past Medical History:  Diagnosis Date  . Breast abscess    rt breast  . Hyperlipidemia   . Hypertension   . Hypertension     Patient Active Problem List   Diagnosis Date Noted  . HERPES LABIALIS 02/22/2009  . TOBACCO ABUSE 02/22/2009  . BACK PAIN, LUMBAR 02/22/2009  . ABDOMINAL PAIN 09/18/2008  . ANXIETY DEPRESSION 07/02/2007  . HYPERCHOLESTEROLEMIA 04/15/2007  . MIGRAINE HEADACHE 04/15/2007  . HYPERTENSION, BENIGN ESSENTIAL 04/15/2007    Past Surgical History:  Procedure Laterality Date  . Cyst removed from L breast    . INCISION AND DRAINAGE ABSCESS Right 10/08/2015   Procedure: INCISION AND DRAINAGE RIGHT BREAST ABSCESS;  Surgeon: De BlanchLuke Aaron Kinsinger, MD;  Location: [facility];  Service: General;  Laterality: Right;  . Surgery on L wrist as a child    . TUBAL LIGATION      OB History    No data available       Home Medications    Prior to Admission medications   Medication Sig Start Date End Date Taking? Authorizing Provider  Diclofenac Sodium CR (VOLTAREN-XR) 100 MG 24 hr tablet Take 1 tablet (100 mg total) by mouth daily. 05/17/16  Yes Daryl F de Villier II, PA    hydrochlorothiazide (HYDRODIURIL) 25 MG tablet Take 1 tablet (25 mg total) by mouth daily. 01/14/16  Yes Hannah Muthersbaugh, PA-C  ibuprofen (ADVIL,MOTRIN) 200 MG tablet Take 800 mg by mouth every 6 (six) hours as needed for moderate pain.   Yes Historical Provider, MD    Family History Family History  Problem Relation Age of Onset  . Diabetes Mother   . Cancer Mother   . Heart failure Father   . Hypertension Father     Social History Social History  Substance Use Topics  . Smoking status: Current Some Day Smoker    Packs/day: 0.50    Types: Cigarettes    Last attempt to quit: 09/19/2011  . Smokeless tobacco: Never Used  . Alcohol use No     Allergies   Review of patient's allergies indicates no known allergies.   Review of Systems Review of Systems  Constitutional: Negative for fever.  Gastrointestinal: Negative for vomiting.  All other systems reviewed and are negative.    Physical Exam Updated Vital Signs BP 161/98   Pulse 79   Temp 98.2 F (36.8 C) (Oral)   Resp 18   SpO2 100%   Physical Exam  Constitutional: She is oriented to person, place, and time. She appears well-developed and well-nourished. No distress.  HENT:  Head: Normocephalic.  Nose: Nose normal.  Eyes: Conjunctivae are normal.  Neck: Neck supple. No tracheal deviation present.  Cardiovascular: Normal rate and regular rhythm.   Pulmonary/Chest: Effort normal. No respiratory distress.  Abdominal: Soft. She exhibits no distension.  Neurological: She is alert and oriented to person, place, and time.  Skin: Skin is warm and dry.  2 cm area of induration, erythema over right upper back  Psychiatric: She has a normal mood and affect.  Vitals reviewed.    ED Treatments / Results  Labs (all labs ordered are listed, but only abnormal results are displayed) Labs Reviewed - No data to display  EKG  EKG Interpretation None       Radiology No results found.  Procedures Procedures  (including critical care time)  INCISION AND DRAINAGE Performed by: Lyndal PulleyKnott, Asiyah Pineau Consent: Verbal consent obtained. Risks and benefits: risks, benefits and alternatives were discussed Type: abscess  Body area: right upper back  Anesthesia: local infiltration  Incision was made with a scalpel.  Local anesthetic: lidocaine 2% w epinephrine  Anesthetic total: 5 ml  Complexity: complex Blunt dissection to break up loculations  Drainage: purulent  Drainage amount: moderate, thick  Packing material: none  Patient tolerance: Patient tolerated the procedure well with no immediate complications.    Medications Ordered in ED Medications - No data to display   Initial Impression / Assessment and Plan / ED Course  I have reviewed the triage vital signs and the nursing notes.  Pertinent labs & imaging results that were available during my care of the patient were reviewed by me and considered in my medical decision making (see chart for details).  Clinical Course    Patient presents with abscess. Located over right upper back. Has history of same. Drained as above with good relief of pressure after local anesthesia. No overlying cellulitis appreciated on exam and no indication for antibiotics at this time. Wound left open to drain and return precautions discussed for wound recheck.    Final Clinical Impressions(s) / ED Diagnoses   Final diagnoses:  Abscess of back    New Prescriptions New Prescriptions   No medications on file     Lyndal Pulleyaniel Latisia Hilaire, MD 06/25/16 [phone]

## 2016-06-25 NOTE — ED Notes (Signed)
Dressing applied to incision on back.

## 2017-04-16 ENCOUNTER — Encounter (HOSPITAL_COMMUNITY): Payer: Self-pay

## 2017-04-16 ENCOUNTER — Emergency Department (HOSPITAL_COMMUNITY)
Admission: EM | Admit: 2017-04-16 | Discharge: 2017-04-17 | Disposition: A | Discharge status: Home / Self Care | Payer: Self-pay | Attending: Emergency Medicine | Admitting: Emergency Medicine

## 2017-04-16 ENCOUNTER — Emergency Department (HOSPITAL_COMMUNITY): Payer: Self-pay

## 2017-04-16 DIAGNOSIS — R55 Syncope and collapse: Secondary | ICD-10-CM | POA: Insufficient documentation

## 2017-04-16 DIAGNOSIS — F17210 Nicotine dependence, cigarettes, uncomplicated: Secondary | ICD-10-CM | POA: Insufficient documentation

## 2017-04-16 DIAGNOSIS — R0789 Other chest pain: Secondary | ICD-10-CM | POA: Insufficient documentation

## 2017-04-16 DIAGNOSIS — H538 Other visual disturbances: Secondary | ICD-10-CM | POA: Insufficient documentation

## 2017-04-16 DIAGNOSIS — R42 Dizziness and giddiness: Secondary | ICD-10-CM | POA: Insufficient documentation

## 2017-04-16 DIAGNOSIS — Z7982 Long term (current) use of aspirin: Secondary | ICD-10-CM | POA: Insufficient documentation

## 2017-04-16 DIAGNOSIS — R51 Headache: Secondary | ICD-10-CM | POA: Insufficient documentation

## 2017-04-16 DIAGNOSIS — I10 Essential (primary) hypertension: Secondary | ICD-10-CM | POA: Insufficient documentation

## 2017-04-16 LAB — CBC
HCT: 48 % — ABNORMAL HIGH (ref 36.0–46.0)
Hemoglobin: 16.6 g/dL — ABNORMAL HIGH (ref 12.0–15.0)
MCH: 31.8 pg (ref 26.0–34.0)
MCHC: 34.6 g/dL (ref 30.0–36.0)
MCV: 92 fL (ref 78.0–100.0)
Platelets: 349 10*3/uL (ref 150–400)
RBC: 5.22 MIL/uL — AB (ref 3.87–5.11)
RDW: 13.5 % (ref 11.5–15.5)
WBC: 9.5 10*3/uL (ref 4.0–10.5)

## 2017-04-16 LAB — BASIC METABOLIC PANEL
Anion gap: 8 (ref 5–15)
BUN: 9 mg/dL (ref 6–20)
CALCIUM: 9 mg/dL (ref 8.9–10.3)
CHLORIDE: 102 mmol/L (ref 101–111)
CO2: 28 mmol/L (ref 22–32)
CREATININE: 0.74 mg/dL (ref 0.44–1.00)
Glucose, Bld: 99 mg/dL (ref 65–99)
Potassium: 3.7 mmol/L (ref 3.5–5.1)
SODIUM: 138 mmol/L (ref 135–145)

## 2017-04-16 LAB — I-STAT TROPONIN, ED: TROPONIN I, POC: 0 ng/mL (ref 0.00–0.08)

## 2017-04-16 MED ORDER — FENTANYL CITRATE (PF) 100 MCG/2ML IJ SOLN
50.0000 ug | Freq: Once | INTRAMUSCULAR | Status: AC
  Administered 2017-04-16: 50 ug via INTRAVENOUS
  Filled 2017-04-16: qty 2

## 2017-04-16 NOTE — ED Provider Notes (Signed)
[facility]-EMERGENCY DEPT Provider Note   CSN: [id number] Arrival date & time: 04/16/17  2031     History   Chief Complaint Chief Complaint  Patient presents with  . Hypertension  . Medication Refill    HPI Rachael Calhoun is a 51 y.o. female.  The history is provided by the patient.  Hypertension  The current episode started more than 1 week ago. The problem occurs daily. The problem has been gradually worsening. Associated symptoms include chest pain and headaches. Pertinent negatives include no abdominal pain. The symptoms are aggravated by walking (standing). The symptoms are relieved by rest.  Medication Refill   Patient presents for multiple complaints:  She reports no BP meds for several months since PCP left town Over past 3 weeks she has had intermittent HA, bilateral neck pain and generalized weakness.  She reports near syncopal episodes when standing, but has not had full LOC.  No trauma She denies visual loss  She also mentions episode of CP yesterday that was unusual for her that resolved without intervention No CP in past 24 hrs   Past Medical History:  Diagnosis Date  . Breast abscess    rt breast  . Hyperlipidemia   . Hypertension   . Hypertension     Patient Active Problem List   Diagnosis Date Noted  . HERPES LABIALIS 02/22/2009  . TOBACCO ABUSE 02/22/2009  . BACK PAIN, LUMBAR 02/22/2009  . ABDOMINAL PAIN 09/18/2008  . ANXIETY DEPRESSION 07/02/2007  . HYPERCHOLESTEROLEMIA 04/15/2007  . MIGRAINE HEADACHE 04/15/2007  . HYPERTENSION, BENIGN ESSENTIAL 04/15/2007    Past Surgical History:  Procedure Laterality Date  . Cyst removed from L breast    . INCISION AND DRAINAGE ABSCESS Right 10/08/2015   Procedure: INCISION AND DRAINAGE RIGHT BREAST ABSCESS;  Surgeon: De Blanch Kinsinger, MD;  Location: [facility];  Service: General;  Laterality: Right;  . Surgery on L wrist as a child    . TUBAL LIGATION      OB History    No data available       Home  Medications    Prior to Admission medications   Medication Sig Start Date End Date Taking? Authorizing Provider  aspirin 81 MG chewable tablet Chew 81 mg by mouth daily.   Yes [provider]  ibuprofen (ADVIL,MOTRIN) 200 MG tablet Take 800 mg by mouth every 6 (six) hours as needed for moderate pain.   Yes [provider]  Diclofenac Sodium CR (VOLTAREN-XR) 100 MG 24 hr tablet Take 1 tablet (100 mg total) by mouth daily. Patient not taking: Reported on 04/16/2017 05/17/16   de Villier, Daryl F II, PA  hydrochlorothiazide (HYDRODIURIL) 25 MG tablet Take 1 tablet (25 mg total) by mouth daily. Patient not taking: Reported on 04/16/2017 01/14/16   Muthersbaugh, Dahlia Client, PA-C    Family History Family History  Problem Relation Age of Onset  . Diabetes Mother   . Cancer Mother   . Heart failure Father   . Hypertension Father     Social History Social History  Substance Use Topics  . Smoking status: Current Some Day Smoker    Packs/day: 0.50    Types: Cigarettes    Last attempt to quit: 09/19/2011  . Smokeless tobacco: Never Used  . Alcohol use No     Allergies   Patient has no known allergies.   Review of Systems Review of Systems  Constitutional: Positive for fatigue. Negative for fever.  Eyes: Negative for visual disturbance.  Cardiovascular: Positive for  chest pain.  Gastrointestinal: Negative for abdominal pain.  Musculoskeletal: Positive for neck pain.  Neurological: Positive for headaches. Negative for syncope.  All other systems reviewed and are negative.    Physical Exam Updated Vital Signs BP (!) 193/116   Pulse 74   Temp 99.5 F (37.5 C) (Oral)   Resp 19   Ht 1.473 m (4\' 10" )   Wt 114.8 kg (253 lb)   LMP 04/16/2017   SpO2 98%   BMI 52.88 kg/m   Physical Exam CONSTITUTIONAL: Well developed/well nourished HEAD: Normocephalic/atraumatic EYES: EOMI/PERRL, no nystagmus, no ptosis ENMT: Mucous membranes moist, poor dentition NECK: supple no  meningeal signs, no bruits SPINE/BACK:entire spine nontender, cervical paraspinal tenderness CV: S1/S2 noted, no murmurs/rubs/gallops noted LUNGS: Lungs are clear to auscultation bilaterally, no apparent distress ABDOMEN: soft, nontender, no rebound or guarding GU:no cva tenderness NEURO:Awake/alert, face symmetric, no arm or leg drift is noted Equal 5/5 strength with shoulder abduction, elbow flex/extension, wrist flex/extension in upper extremities and equal hand grips bilaterally Equal 5/5 strength with hip flexion,knee flex/extension, foot dorsi/plantar flexion Cranial nerves 3/4/5/6/03/02/09/11/12 tested and intact No past pointing Sensation to light touch intact in all extremities EXTREMITIES: pulses normal, full ROM SKIN: warm, color normal PSYCH: no abnormalities of mood noted, alert and oriented to situation  ED Treatments / Results  Labs (all labs ordered are listed, but only abnormal results are displayed) Labs Reviewed  CBC - Abnormal; Notable for the following:       Result Value   RBC 5.22 (*)    Hemoglobin 16.6 (*)    HCT 48.0 (*)    All other components within normal limits  BASIC METABOLIC PANEL  I-STAT TROPONIN, ED    EKG ED ECG REPORT   Date: 04/16/2017 2308  Rate: 71  Rhythm: normal sinus rhythm  QRS Axis: normal  Intervals: normal  ST/T Wave abnormalities: t wave abnormality  Conduction Disutrbances:none  Narrative Interpretation:   Old EKG Reviewed: unchanged  I have personally reviewed the EKG tracing and agree with the computerized printout as noted.  Radiology Dg Chest 2 View  Result Date: 04/17/2017 CLINICAL DATA:  Mid chest pain radiating to both sides of neck for 3 weeks. EXAM: CHEST  2 VIEW COMPARISON:  Radiographs 07/20/2015 FINDINGS: Heart at the upper limits normal in size. The cardiomediastinal contours are normal. The lungs are clear. Pulmonary vasculature is normal. No consolidation, pleural effusion, or pneumothorax. No acute osseous  abnormalities are seen. IMPRESSION: Upper normal heart size.  No acute pulmonary process. Electronically Signed   By: Rubye Oaks M.D.   On: 04/17/2017 00:03    Procedures Procedures    Medications Ordered in ED Medications  fentaNYL (SUBLIMAZE) injection 50 mcg (50 mcg Intravenous Given 04/16/17 2352)     Initial Impression / Assessment and Plan / ED Course  I have reviewed the triage vital signs and the nursing notes.  Pertinent labs & imaging results that were available during my care of the patient were reviewed by me and considered in my medical decision making (see chart for details).     11:46 PM Pt here for multiple issues, but mainly is out of her BP meds EKG unchanged Troponin negative Doubt ACS/PE/Dissection at this time No signs of stroke on exam Pt will need to ambulate Plan to treat neck pain, labs/imaging and reassess Will need BP meds restarted    After monitoring in the ED, pt improved She is ambulatory without difficulty Vitals improved No active CP No focal  weakness Will d/c home Will start HCTZ We discussed strict ER return precautions   Final Clinical Impressions(s) / ED Diagnoses   Final diagnoses:  Essential hypertension  Near syncope    New Prescriptions Discharge Medication List as of 04/17/2017  1:27 AM       Zadie Rhine, MD 04/17/17 [phone]

## 2017-04-16 NOTE — ED Triage Notes (Addendum)
Patient presents with hypertension. Patient ran out of her anti-hypertensive medication 6 months ago. Patient reports headaches, dizziness, "feeling like Im gonna pass out when I stand up," blurred vision intermittently, and generalized aches. Patient denies SOB/CP at this time- patient reports in the past week, she has experienced "crushing" chest pain "where I felt like I couldn't breathe. I couldn't move. I had to wait till it passed to get up and call my son." Patient states she needs a refill of her medication before going home. Patient states her PCP moved to [place] and she no longer has a PCP.

## 2017-04-16 NOTE — ED Notes (Signed)
Patient transported to X-ray 

## 2017-04-17 MED ORDER — HYDROCHLOROTHIAZIDE 25 MG PO TABS: 25.0000 mg | ORAL_TABLET | Freq: Every day | ORAL | 0 refills | Status: DC

## 2018-07-07 ENCOUNTER — Emergency Department (HOSPITAL_COMMUNITY): Payer: Self-pay

## 2018-07-07 ENCOUNTER — Other Ambulatory Visit: Payer: Self-pay

## 2018-07-07 ENCOUNTER — Encounter (HOSPITAL_COMMUNITY): Payer: Self-pay | Admitting: Emergency Medicine

## 2018-07-07 ENCOUNTER — Observation Stay (HOSPITAL_COMMUNITY)
Admission: EM | Admit: 2018-07-07 | Discharge: 2018-07-08 | Disposition: A | Discharge status: Home / Self Care | Payer: Self-pay | Attending: Student in an Organized Health Care Education/Training Program | Admitting: Student in an Organized Health Care Education/Training Program

## 2018-07-07 DIAGNOSIS — R0789 Other chest pain: Principal | ICD-10-CM | POA: Insufficient documentation

## 2018-07-07 DIAGNOSIS — I160 Hypertensive urgency: Secondary | ICD-10-CM | POA: Insufficient documentation

## 2018-07-07 DIAGNOSIS — E785 Hyperlipidemia, unspecified: Secondary | ICD-10-CM | POA: Diagnosis present

## 2018-07-07 DIAGNOSIS — Z8249 Family history of ischemic heart disease and other diseases of the circulatory system: Secondary | ICD-10-CM | POA: Insufficient documentation

## 2018-07-07 DIAGNOSIS — F17210 Nicotine dependence, cigarettes, uncomplicated: Secondary | ICD-10-CM | POA: Insufficient documentation

## 2018-07-07 DIAGNOSIS — I10 Essential (primary) hypertension: Secondary | ICD-10-CM | POA: Insufficient documentation

## 2018-07-07 DIAGNOSIS — F419 Anxiety disorder, unspecified: Secondary | ICD-10-CM | POA: Insufficient documentation

## 2018-07-07 DIAGNOSIS — K0889 Other specified disorders of teeth and supporting structures: Secondary | ICD-10-CM

## 2018-07-07 DIAGNOSIS — I2510 Atherosclerotic heart disease of native coronary artery without angina pectoris: Secondary | ICD-10-CM | POA: Insufficient documentation

## 2018-07-07 DIAGNOSIS — Z79899 Other long term (current) drug therapy: Secondary | ICD-10-CM | POA: Insufficient documentation

## 2018-07-07 DIAGNOSIS — E7800 Pure hypercholesterolemia, unspecified: Secondary | ICD-10-CM | POA: Insufficient documentation

## 2018-07-07 DIAGNOSIS — F17200 Nicotine dependence, unspecified, uncomplicated: Secondary | ICD-10-CM | POA: Diagnosis present

## 2018-07-07 DIAGNOSIS — M1990 Unspecified osteoarthritis, unspecified site: Secondary | ICD-10-CM | POA: Insufficient documentation

## 2018-07-07 HISTORY — DX: Unspecified osteoarthritis, unspecified site: M19.90

## 2018-07-07 HISTORY — DX: Anxiety disorder, unspecified: F41.9

## 2018-07-07 LAB — CBC WITH DIFFERENTIAL/PLATELET
Abs Immature Granulocytes: 0.04 10*3/uL (ref 0.00–0.07)
BASOS PCT: 0 %
Basophils Absolute: 0 10*3/uL (ref 0.0–0.1)
EOS ABS: 0.1 10*3/uL (ref 0.0–0.5)
Eosinophils Relative: 1 %
HCT: 48.8 % — ABNORMAL HIGH (ref 36.0–46.0)
Hemoglobin: 16.3 g/dL — ABNORMAL HIGH (ref 12.0–15.0)
IMMATURE GRANULOCYTES: 0 %
Lymphocytes Relative: 25 %
Lymphs Abs: 2.8 10*3/uL (ref 0.7–4.0)
MCH: 31.6 pg (ref 26.0–34.0)
MCHC: 33.4 g/dL (ref 30.0–36.0)
MCV: 94.6 fL (ref 80.0–100.0)
Monocytes Absolute: 0.5 10*3/uL (ref 0.1–1.0)
Monocytes Relative: 5 %
NEUTROS ABS: 7.7 10*3/uL (ref 1.7–7.7)
NEUTROS PCT: 69 %
PLATELETS: 307 10*3/uL (ref 150–400)
RBC: 5.16 MIL/uL — AB (ref 3.87–5.11)
RDW: 13.5 % (ref 11.5–15.5)
WBC: 11.2 10*3/uL — AB (ref 4.0–10.5)
nRBC: 0 % (ref 0.0–0.2)

## 2018-07-07 LAB — COMPREHENSIVE METABOLIC PANEL
ALT: 15 U/L (ref 0–44)
AST: 22 U/L (ref 15–41)
Albumin: 4.1 g/dL (ref 3.5–5.0)
Alkaline Phosphatase: 97 U/L (ref 38–126)
Anion gap: 11 (ref 5–15)
BUN: 10 mg/dL (ref 6–20)
CHLORIDE: 103 mmol/L (ref 98–111)
CO2: 25 mmol/L (ref 22–32)
CREATININE: 0.83 mg/dL (ref 0.44–1.00)
Calcium: 9.1 mg/dL (ref 8.9–10.3)
Glucose, Bld: 97 mg/dL (ref 70–99)
POTASSIUM: 3.5 mmol/L (ref 3.5–5.1)
SODIUM: 139 mmol/L (ref 135–145)
Total Bilirubin: 0.7 mg/dL (ref 0.3–1.2)
Total Protein: 6.7 g/dL (ref 6.5–8.1)

## 2018-07-07 LAB — TROPONIN I

## 2018-07-07 LAB — D-DIMER, QUANTITATIVE (NOT AT ARMC): D DIMER QUANT: 0.48 ug{FEU}/mL (ref 0.00–0.50)

## 2018-07-07 LAB — I-STAT TROPONIN, ED: TROPONIN I, POC: 0 ng/mL (ref 0.00–0.08)

## 2018-07-07 LAB — LIPID PANEL
CHOLESTEROL: 272 mg/dL — AB (ref 0–200)
HDL: 46 mg/dL (ref >40)
LDL CALC: 196 mg/dL — AB (ref 0–99)
Total CHOL/HDL Ratio: 5.9 RATIO
Triglycerides: 148 mg/dL (ref <150)
VLDL: 30 mg/dL (ref 0–40)

## 2018-07-07 MED ORDER — ONDANSETRON HCL 4 MG PO TABS: 4.0000 mg | ORAL_TABLET | Freq: Four times a day (QID) | ORAL | Status: DC | PRN

## 2018-07-07 MED ORDER — NITROGLYCERIN 2 % TD OINT
1.0000 [in_us] | TOPICAL_OINTMENT | Freq: Once | TRANSDERMAL | Status: AC
  Administered 2018-07-07: 1 [in_us] via TOPICAL
  Filled 2018-07-07: qty 1

## 2018-07-07 MED ORDER — LABETALOL HCL 5 MG/ML IV SOLN
20.0000 mg | Freq: Once | INTRAVENOUS | Status: AC
  Administered 2018-07-07: 20 mg via INTRAVENOUS
  Filled 2018-07-07: qty 4

## 2018-07-07 MED ORDER — ONDANSETRON HCL 4 MG/2ML IJ SOLN: 4.0000 mg | Freq: Four times a day (QID) | INTRAMUSCULAR | Status: DC | PRN

## 2018-07-07 MED ORDER — ENOXAPARIN SODIUM 40 MG/0.4ML ~~LOC~~ SOLN
40.0000 mg | SUBCUTANEOUS | Status: DC
  Filled 2018-07-07: qty 0.4

## 2018-07-07 MED ORDER — KETOROLAC TROMETHAMINE 30 MG/ML IJ SOLN
30.0000 mg | Freq: Once | INTRAMUSCULAR | Status: AC
  Administered 2018-07-07: 30 mg via INTRAVENOUS
  Filled 2018-07-07: qty 1

## 2018-07-07 MED ORDER — NITROGLYCERIN 0.4 MG SL SUBL: 0.4000 mg | SUBLINGUAL_TABLET | SUBLINGUAL | Status: DC | PRN

## 2018-07-07 MED ORDER — ASPIRIN 81 MG PO CHEW: 81.0000 mg | CHEWABLE_TABLET | Freq: Once | ORAL | Status: DC

## 2018-07-07 MED ORDER — ACETAMINOPHEN 325 MG PO TABS
650.0000 mg | ORAL_TABLET | Freq: Four times a day (QID) | ORAL | Status: DC | PRN
  Administered 2018-07-07 – 2018-07-08 (×3): 650 mg via ORAL
  Filled 2018-07-07 (×3): qty 2

## 2018-07-07 MED ORDER — ACETAMINOPHEN 650 MG RE SUPP: 650.0000 mg | Freq: Four times a day (QID) | RECTAL | Status: DC | PRN

## 2018-07-07 MED ORDER — SODIUM CHLORIDE 0.9% FLUSH
3.0000 mL | Freq: Two times a day (BID) | INTRAVENOUS | Status: DC
  Administered 2018-07-07 – 2018-07-08 (×2): 3 mL via INTRAVENOUS

## 2018-07-07 MED ORDER — HYDROCHLOROTHIAZIDE 12.5 MG PO CAPS
12.5000 mg | ORAL_CAPSULE | Freq: Every day | ORAL | Status: DC
  Administered 2018-07-08: 12.5 mg via ORAL
  Filled 2018-07-07: qty 1

## 2018-07-07 MED ORDER — LABETALOL HCL 5 MG/ML IV SOLN: 10.0000 mg | Freq: Once | INTRAVENOUS | Status: DC

## 2018-07-07 MED ORDER — ASPIRIN 81 MG PO CHEW
324.0000 mg | CHEWABLE_TABLET | Freq: Once | ORAL | Status: AC
  Administered 2018-07-07: 324 mg via ORAL
  Filled 2018-07-07: qty 4

## 2018-07-07 MED ORDER — NICOTINE 14 MG/24HR TD PT24
14.0000 mg | MEDICATED_PATCH | Freq: Every day | TRANSDERMAL | Status: DC
  Administered 2018-07-07 – 2018-07-08 (×2): 14 mg via TRANSDERMAL
  Filled 2018-07-07 (×2): qty 1

## 2018-07-07 MED ORDER — HYDRALAZINE HCL 20 MG/ML IJ SOLN
5.0000 mg | Freq: Once | INTRAMUSCULAR | Status: DC
  Filled 2018-07-07: qty 1

## 2018-07-07 NOTE — ED Notes (Addendum)
Patient reports that she is allergic to a Blood Pressure medicine but does not remember which one She states her throat was swollen

## 2018-07-07 NOTE — ED Triage Notes (Signed)
Patient arrived from work related to headache X1 WEEK, chest pain X 2 days, today she reports that the chest pain is radiating to her neck. PT also that she has not been taking blood pressure medicine.  EDP at bedside

## 2018-07-07 NOTE — Plan of Care (Signed)
Educated pt on the importance of establishing a PCP and being compliant with medications. I have requested a case management and social work consult.

## 2018-07-07 NOTE — ED Provider Notes (Signed)
[facility] EMERGENCY DEPARTMENT Provider Note   CSN: [id number] Arrival date & time: 07/07/18  1221     History   Chief Complaint Chief Complaint  Patient presents with  . Chest Pain    HPI Sherlene Rickel is a 52 y.o. female.  HPI 52 year old female with history of hypertension, hyperlipidemia, smoking, strong family history of heart disease, here with chest pain.  The patient states that over the last several days, she has had intermittent episodes of chest pressure radiating up towards her right jaw.  She said associated increased blood pressure and has not been taking any of her medications.  She has some associated shortness of breath.  She endorses multiple recent stressors which seem to have worsened her symptoms.  Symptoms also seem worse with exertion.  No alleviating factors.  No personal history of heart disease but her father and multiple family members have had MIs in their 23s and 24s.  She continues to smoke regularly.     Past Medical History:  Diagnosis Date  . Anxiety   . Arthritis   . Breast abscess    rt breast  . Hyperlipidemia   . Hypertension   . Hypertension     Patient Active Problem List   Diagnosis Date Noted  . Chest pain 07/07/2018  . HERPES LABIALIS 02/22/2009  . TOBACCO ABUSE 02/22/2009  . BACK PAIN, LUMBAR 02/22/2009  . ABDOMINAL PAIN 09/18/2008  . ANXIETY DEPRESSION 07/02/2007  . HYPERCHOLESTEROLEMIA 04/15/2007  . MIGRAINE HEADACHE 04/15/2007  . HYPERTENSION, BENIGN ESSENTIAL 04/15/2007    Past Surgical History:  Procedure Laterality Date  . Cyst removed from L breast    . INCISION AND DRAINAGE ABSCESS Right 10/08/2015   Procedure: INCISION AND DRAINAGE RIGHT BREAST ABSCESS;  Surgeon: De Blanch Kinsinger, MD;  Location: [facility];  Service: General;  Laterality: Right;  . Surgery on L wrist as a child    . TUBAL LIGATION       OB History   None      Home Medications    Prior to Admission medications     Medication Sig Start Date End Date Taking? Authorizing Provider  ibuprofen (ADVIL,MOTRIN) 200 MG tablet Take 800 mg by mouth every 6 (six) hours as needed for moderate pain.   Yes [provider]  naproxen sodium (ALEVE) 220 MG tablet Take 220 mg by mouth daily as needed (chest pain).   Yes [provider]  hydrochlorothiazide (HYDRODIURIL) 25 MG tablet Take 1 tablet (25 mg total) by mouth daily. Patient not taking: Reported on 07/07/2018 04/17/17   Zadie Rhine, MD    Family History Family History  Problem Relation Age of Onset  . Diabetes Mother   . Cancer Mother   . Heart failure Father   . Hypertension Father     Social History Social History   Tobacco Use  . Smoking status: Current Every Day Smoker    Packs/day: 0.50    Types: Cigarettes  . Smokeless tobacco: Never Used  Substance Use Topics  . Alcohol use: No  . Drug use: No     Allergies   Patient has no known allergies.   Review of Systems Review of Systems  Constitutional: Positive for fatigue. Negative for chills and fever.  HENT: Negative for congestion and rhinorrhea.   Eyes: Negative for visual disturbance.  Respiratory: Positive for chest tightness and shortness of breath. Negative for cough and wheezing.   Cardiovascular: Positive for chest pain. Negative for leg swelling.  Gastrointestinal: Negative for abdominal pain, diarrhea, nausea and vomiting.  Genitourinary: Negative for dysuria and flank pain.  Musculoskeletal: Negative for neck pain and neck stiffness.  Skin: Negative for rash and wound.  Allergic/Immunologic: Negative for immunocompromised state.  Neurological: Negative for syncope, weakness and headaches.  All other systems reviewed and are negative.    Physical Exam Updated Vital Signs BP (!) 166/97 (BP Location: Left Arm)   Pulse 70   Temp 98.2 F (36.8 C) (Oral)   Resp 18   Ht 4\' 10"  (1.473 m)   Wt 113.4 kg   LMP 05/07/2018 (Approximate) Comment: Pt  states her period has become irregular.  SpO2 100%   BMI 52.25 kg/m   Physical Exam  Constitutional: She is oriented to person, place, and time. She appears well-developed and well-nourished. No distress.  HENT:  Head: Normocephalic and atraumatic.  Eyes: Conjunctivae are normal.  Neck: Neck supple.  Cardiovascular: Normal rate, regular rhythm and normal heart sounds. Exam reveals no friction rub.  No murmur heard. Pulmonary/Chest: Effort normal and breath sounds normal. No respiratory distress. She has no wheezes. She has no rales.  Abdominal: She exhibits no distension.  Musculoskeletal: She exhibits no edema.  Neurological: She is alert and oriented to person, place, and time. She exhibits normal muscle tone.  Skin: Skin is warm. Capillary refill takes less than 2 seconds.  Psychiatric: She has a normal mood and affect.  Nursing note and vitals reviewed.    ED Treatments / Results  Labs (all labs ordered are listed, but only abnormal results are displayed) Labs Reviewed  CBC WITH DIFFERENTIAL/PLATELET - Abnormal; Notable for the following components:      Result Value   WBC 11.2 (*)    RBC 5.16 (*)    Hemoglobin 16.3 (*)    HCT 48.8 (*)    All other components within normal limits  LIPID PANEL - Abnormal; Notable for the following components:   Cholesterol 272 (*)    LDL Cholesterol 196 (*)    All other components within normal limits  COMPREHENSIVE METABOLIC PANEL  TROPONIN I  D-DIMER, QUANTITATIVE (NOT AT [facility])  TROPONIN I  TROPONIN I  TROPONIN I  HEMOGLOBIN A1C  HIV ANTIBODY (ROUTINE TESTING W REFLEX)  I-STAT TROPONIN, ED    EKG EKG Interpretation  Date/Time:  Wednesday July 07 2018 12:36:02 EST Ventricular Rate:  108 PR Interval:    QRS Duration: 85 QT Interval:  340 QTC Calculation: 456 R Axis:   82 Text Interpretation:  Sinus tachycardia Probable left atrial enlargement Borderline repolarization abnormality Since last EKG, rate remain elevated  Persistent TWI in inferor leads and non-specific changes laterally Confirmed by Shaune PollackIsaacs, Granvel Proudfoot [phone]([id number]) on 07/08/2018 8:32:07 AM   Radiology Dg Chest Portable 1 View  Result Date: 07/07/2018 CLINICAL DATA:  Chest pain and shortness of breath EXAM: PORTABLE CHEST 1 VIEW COMPARISON:  April 16, 2017. FINDINGS: No edema or consolidation. Heart is upper normal in size with pulmonary vascularity normal. No adenopathy. No pneumothorax. No bone lesions. IMPRESSION: No edema or consolidation.  Heart upper normal in size. Electronically Signed   By: Bretta BangWilliam  Woodruff III M.D.   On: 07/07/2018 12:48    Procedures Procedures (including critical care time)  Medications Ordered in ED Medications  enoxaparin (LOVENOX) injection 40 mg (40 mg Subcutaneous Not Given 07/07/18 2203)  sodium chloride flush (NS) 0.9 % injection 3 mL (3 mLs Intravenous Given 07/07/18 2205)  nicotine (NICODERM CQ - dosed in mg/24 hours) patch 14 mg (  14 mg Transdermal Patch Applied 07/07/18 2203)  acetaminophen (TYLENOL) tablet 650 mg (650 mg Oral Given 07/08/18 0503)    Or  acetaminophen (TYLENOL) suppository 650 mg ( Rectal See Alternative 07/08/18 0503)  ondansetron (ZOFRAN) tablet 4 mg (has no administration in time range)    Or  ondansetron (ZOFRAN) injection 4 mg (has no administration in time range)  nitroGLYCERIN (NITROSTAT) SL tablet 0.4 mg (has no administration in time range)  hydrochlorothiazide (MICROZIDE) capsule 12.5 mg (has no administration in time range)  hydrALAZINE (APRESOLINE) injection 5 mg (5 mg Intravenous Not Given 07/07/18 1929)  nitroGLYCERIN (NITROGLYN) 2 % ointment 1 inch (1 inch Topical Given 07/07/18 1442)  aspirin chewable tablet 324 mg (324 mg Oral Given 07/07/18 1442)  labetalol (NORMODYNE,TRANDATE) injection 20 mg (20 mg Intravenous Given 07/07/18 1508)  ketorolac (TORADOL) 30 MG/ML injection 30 mg (30 mg Intravenous Given 07/07/18 1927)     Initial Impression / Assessment and Plan / ED  Course  I have reviewed the triage vital signs and the nursing notes.  Pertinent labs & imaging results that were available during my care of the patient were reviewed by me and considered in my medical decision making (see chart for details).    52 year old female here with chest pain radiating to her jaw.  Blood pressure markedly elevated on arrival.  Concern for hypertensive urgency versus ACS.  EKG nonischemic with initial negative troponin.  Patient given nitroglycerin and labetalol with improvement.  Will admit  Final Clinical Impressions(s) / ED Diagnoses   Final diagnoses:  Atypical chest pain  Hypertensive urgency    ED Discharge Orders    None       Shaune Pollack, MD 07/08/18 [phone]
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
## 2018-07-08 DIAGNOSIS — Z7982 Long term (current) use of aspirin: Secondary | ICD-10-CM

## 2018-07-08 DIAGNOSIS — Z720 Tobacco use: Secondary | ICD-10-CM

## 2018-07-08 DIAGNOSIS — Z79899 Other long term (current) drug therapy: Secondary | ICD-10-CM

## 2018-07-08 DIAGNOSIS — I160 Hypertensive urgency: Secondary | ICD-10-CM

## 2018-07-08 DIAGNOSIS — F17200 Nicotine dependence, unspecified, uncomplicated: Secondary | ICD-10-CM | POA: Diagnosis present

## 2018-07-08 DIAGNOSIS — I10 Essential (primary) hypertension: Secondary | ICD-10-CM | POA: Diagnosis present

## 2018-07-08 DIAGNOSIS — E785 Hyperlipidemia, unspecified: Secondary | ICD-10-CM | POA: Diagnosis present

## 2018-07-08 LAB — HIV ANTIBODY (ROUTINE TESTING W REFLEX): HIV SCREEN 4TH GENERATION: NONREACTIVE

## 2018-07-08 LAB — HEMOGLOBIN A1C
HEMOGLOBIN A1C: 5.1 % (ref 4.8–5.6)
MEAN PLASMA GLUCOSE: 99.67 mg/dL

## 2018-07-08 LAB — TROPONIN I
Troponin I: <0.03 ng/mL (ref <0.03)
Troponin I: <0.03 ng/mL (ref <0.03)

## 2018-07-08 MED ORDER — ATORVASTATIN CALCIUM 40 MG PO TABS: 40.0000 mg | ORAL_TABLET | Freq: Every day | ORAL | 0 refills | Status: DC

## 2018-07-08 MED ORDER — ASPIRIN EC 81 MG PO TBEC: 81.0000 mg | DELAYED_RELEASE_TABLET | Freq: Every day | ORAL | 0 refills | Status: AC

## 2018-07-08 MED ORDER — HYDROCHLOROTHIAZIDE 12.5 MG PO CAPS: 12.5000 mg | ORAL_CAPSULE | Freq: Every day | ORAL | 0 refills | Status: DC

## 2018-07-08 NOTE — Progress Notes (Signed)
   Subjective:  She feels well. No chest pain, SOB, or nausea today. She states she is ready to go home. We discussed plans to establish PCP   Objective:  Vital signs in last 24 hours: Vitals:   07/07/18 1809 07/07/18 2200 07/07/18 2252 07/08/18 0558  BP: (!) 141/89 (!) 146/78 (!) 148/60 (!) 166/97  Pulse: 100 86 78 70  Resp: 18  18 18   Temp: 98.1 F (36.7 C)  98.2 F (36.8 C) 98.2 F (36.8 C)  TempSrc: Oral  Oral Oral  SpO2: 96% 95% 96% 100%  Weight:      Height:       Physical Exam: Constitution: NAD, lying supine in bed Cardio: RRR, no m/r/g Respiratory: non-labored breathing MSK: moving all extremities Neuro: a&o, normal affect Skin: c/d/i    Assessment/Plan:  Active Problems:   Chest pain  Atypical Chest Pain Hypertension Troponins negative overnight with no recurrence of symptoms, and normal EKG this morning. With her multiple risk factors her ASCVD risk is 20%, and we will start a high-intensity statin and aspirin 80mg . Discussed with case management and have scheduled close follow-up so she may establish with a PCP at discharge. We also discussed the importance of smoking cessation to help decrease her risk of MI and ischemic heart disease in the long term. Will recommend outpatient stress test for patient with her new PCP.   - ordered HCTZ 12.5 and atorvastatin 40 [facility].  VTE: lovenox IVF: none Diet: heart healthy Code: Full code   Dispo: Anticipated discharge today.   Guinevere ScarletSeawell, Jaimie A, DO 07/08/2018, 8:26 AM Pager: [phone]
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
## 2018-07-08 NOTE — Care Management Note (Signed)
Case Management Note  Patient Details  Name: Carren Blakley MRN: [id number] Date of Birth: 07-30-1966  Subjective/Objective:   53yo female presented wit CP. PMH: HTN                Action/Plan: CM consult acknowledged. CM met with patient to discuss transitional needs. Patient lives at home with her family, employed, independent with ADLs with no DME in use. Patient verbalized having no health insurance or established PCP, but agreeable to assistance from CM. CM arranged a hospital f/u appointment at: [facility] on 07/19/18 @ 1610; patient can utilize [facility] service for medications prior to discharge, and [facility] for $4-$10 Rxs; AVS updated. Patient requesting assistance with transportation home, with a bus ticket to be provided.    Expected Discharge Date:                  Expected Discharge Plan:  Home/Self Care  In-House Referral:  NA  Discharge planning Services  CM Consult, Medication Assistance, Follow-up appt scheduled, Other - See comment(Bus Ticket)  Post Acute Care Choice:  NA Choice offered to:  NA  DME Arranged:  N/A DME Agency:  NA  HH Arranged:  NA HH Agency:  NA  Status of Service:  Completed, signed off  If discussed at [place] of Stay Meetings, dates discussed:    Additional Comments:  Midge Minium RN, BSN, NCM-BC, ACM-RN [phone] 07/08/2018, 10:09 AM

## 2018-07-08 NOTE — Progress Notes (Signed)
Assumed care from Elizabeth, RN. 

## 2018-07-19 ENCOUNTER — Ambulatory Visit (INDEPENDENT_AMBULATORY_CARE_PROVIDER_SITE_OTHER): Payer: Self-pay | Admitting: Family Medicine

## 2018-07-19 ENCOUNTER — Encounter: Payer: Self-pay | Admitting: Family Medicine

## 2018-07-19 VITALS — BP 166/93 | HR 83 | Temp 97.8°F | Resp 17 | Ht 61.0 in | Wt 237.8 lb

## 2018-07-19 DIAGNOSIS — E7841 Elevated Lipoprotein(a): Secondary | ICD-10-CM

## 2018-07-19 DIAGNOSIS — R0789 Other chest pain: Secondary | ICD-10-CM

## 2018-07-19 DIAGNOSIS — J209 Acute bronchitis, unspecified: Secondary | ICD-10-CM

## 2018-07-19 DIAGNOSIS — Z716 Tobacco abuse counseling: Secondary | ICD-10-CM

## 2018-07-19 DIAGNOSIS — Z6841 Body Mass Index (BMI) 40.0 and over, adult: Secondary | ICD-10-CM

## 2018-07-19 DIAGNOSIS — I10 Essential (primary) hypertension: Secondary | ICD-10-CM

## 2018-07-19 DIAGNOSIS — Z7689 Persons encountering health services in other specified circumstances: Secondary | ICD-10-CM

## 2018-07-19 DIAGNOSIS — J440 Chronic obstructive pulmonary disease with acute lower respiratory infection: Secondary | ICD-10-CM

## 2018-07-19 MED ORDER — HYDROCODONE-HOMATROPINE 5-1.5 MG/5ML PO SYRP: 5.0000 mL | ORAL_SOLUTION | Freq: Three times a day (TID) | ORAL | 0 refills | Status: DC | PRN

## 2018-07-19 MED ORDER — ALBUTEROL SULFATE HFA 108 (90 BASE) MCG/ACT IN AERS: 2.0000 | INHALATION_SPRAY | Freq: Four times a day (QID) | RESPIRATORY_TRACT | 0 refills | Status: DC | PRN

## 2018-07-19 MED ORDER — BUPROPION HCL ER (XL) 150 MG PO TB24: 150.0000 mg | ORAL_TABLET | Freq: Every day | ORAL | 3 refills | Status: DC

## 2018-07-19 MED ORDER — PREDNISONE 20 MG PO TABS: 40.0000 mg | ORAL_TABLET | Freq: Every day | ORAL | 0 refills | Status: DC

## 2018-07-19 NOTE — Progress Notes (Signed)
Rachael Calhoun Calhoun, is a 52 y.o. female  ZOX:[id number]  [id number]  DOB - 07/30/1966  CC:  Chief Complaint  Patient presents with  . Establish Care  . Hospitalization Follow-up    ED->Hosp 11/13-11/14: atypical chest pain. has mostly resolved.       HPI: Rachael Calhoun is a 52 y.o. female is here today to establish care.   Rachael Calhoun has Atypical chest pain; Essential hypertension; Hyperlipemia; and Tobacco use disorder on their problem list.    Today's visit:  Rachael Calhoun Hipp is here to establish care as new patient. Recently hospitalized for chest pain which she reports has completely resolved since ED admission. Risk factors for CAD include obesity, chronic tobacco use, hypertension, and tobacco use. Hospital EKG NSR no ischemic changes.  She complains today of chronic occasional productive cough and rattle in chest. Reports associated shortness of breath which is worst with cough and laying down. She has occasional wheezing. Reports a history of asthma which is worst during the winter months. Very long history of smoking. Denies prior history of pneumonia. Denies headaches, chest pain, abdominal pain, nausea, new weakness, unintentional weight gain , or edema. Discharge hospitalist recommended out patient cardiac stress test.  Hypertension  Out of blood pressure medication and cholesterol medication. She was able to pick-up medications once discharged from hospital.Envi reports no home monitoring of blood pressure. Reports adherence to blood pressure medications. Reports efforts to non adherence to low sodium diet and engages in no routine physical exercise. Current Body mass index is 44.93 kg/m.   Current medications: Current Outpatient Medications:  .  aspirin EC 81 MG tablet, Take 1 tablet (81 mg total) by mouth daily., Disp: 30 tablet, Rfl: 0 .  atorvastatin (LIPITOR) 40 MG tablet, Take 1 tablet (40 mg total) by mouth daily., Disp: 30 tablet, Rfl: 0 .  hydrochlorothiazide (MICROZIDE)  12.5 MG capsule, Take 1 capsule (12.5 mg total) by mouth daily., Disp: 30 capsule, Rfl: 0   Pertinent family medical history: family history includes Cancer in her mother; Diabetes in her mother; Heart failure in her father; Hypertension in her father.   No Known Allergies  Social History   Socioeconomic History  . Marital status: Legally Separated    Spouse name: Not on file  . Number of children: Not on file  . Years of education: Not on file  . Highest education level: Not on file  Occupational History  . Not on file  Social Needs  . Financial resource strain: Not on file  . Food insecurity:    Worry: Not on file    Inability: Not on file  . Transportation needs:    Medical: Not on file    Non-medical: Not on file  Tobacco Use  . Smoking status: Current Every Day Smoker    Packs/day: 0.50    Types: Cigarettes  . Smokeless tobacco: Never Used  Substance and Sexual Activity  . Alcohol use: No  . Drug use: No  . Sexual activity: Yes  Lifestyle  . Physical activity:    Days per week: Not on file    Minutes per session: Not on file  . Stress: Not on file  Relationships  . Social connections:    Talks on phone: Not on file    Gets together: Not on file    Attends religious service: Not on file    Active member of club or organization: Not on file    Attends meetings of clubs or organizations: Not on file  Relationship status: Not on file  . Intimate partner violence:    Fear of current or ex partner: Not on file    Emotionally abused: Not on file    Physically abused: Not on file    Forced sexual activity: Not on file  Other Topics Concern  . Not on file  Social History Narrative  . Not on file    Review of Systems: Constitutional: Negative for fever, chills, diaphoresis, activity change, appetite change and fatigue. HENT: Negative for ear pain, nosebleeds, congestion, facial swelling, rhinorrhea, neck pain, neck stiffness and ear discharge.  Eyes: Negative  for pain, discharge, redness, itching and visual disturbance. Respiratory: See HPI Cardiovascular: See HPI Musculoskeletal: Negative for back pain, joint swelling, arthralgia and gait problem. Neurological: Negative for dizziness, tremors, seizures, syncope, facial asymmetry, speech difficulty, weakness, light-headedness, numbness and headaches.  Hematological: Negative for adenopathy. Does not bruise/bleed easily. Psychiatric/Behavioral: Negative for hallucinations, behavioral problems, confusion, dysphoric mood, decreased concentration and agitation.    Objective:   Vitals:   07/19/18 1617  BP: (!) 166/93  Pulse: 83  Resp: 17  Temp: 97.8 F (36.6 C)  SpO2: 93%    BP Readings from Last 3 Encounters:  07/19/18 (!) 166/93  07/08/18 (!) 149/57  04/17/17 (!) 143/80    Filed Weights   07/19/18 1617  Weight: 237 lb 12.8 oz (107.9 kg)      Physical Exam: Constitutional: Patient appears well-developed and well-nourished. No distress. HENT: Normocephalic, atraumatic, External right and left ear normal. Oropharynx is clear and moist.  Eyes: Conjunctivae and EOM are normal. PERRLA, no scleral icterus. Neck: Normal ROM. Neck supple. No JVD. No tracheal deviation. No thyromegaly. CVS: RRR, S1/S2 +, no murmurs, no gallops, no carotid bruit.  Pulmonary: Effort and breath sounds normal, no stridor, rhonchi, wheezes, rales.  Abdominal: Soft. BS +, no distension, tenderness, rebound or guarding.  Musculoskeletal: Normal range of motion. No edema and no tenderness.  Neuro: Alert. Normal muscle tone coordination. Normal gait. BUE and BLE strength 5/5. Bilateral hand grips symmetrical. No cranial nerve deficit. Skin: Skin is warm and dry. No rash noted. Not diaphoretic. No erythema. No pallor. Psychiatric: Normal mood and affect. Behavior, judgment, thought content normal.  Lab Results (prior encounters)  Lab Results  Component Value Date   WBC 11.2 (H) 07/07/2018   HGB 16.3 (H)  07/07/2018   HCT 48.8 (H) 07/07/2018   MCV 94.6 07/07/2018   PLT 307 07/07/2018   Lab Results  Component Value Date   CREATININE 0.83 07/07/2018   BUN 10 07/07/2018   NA 139 07/07/2018   K 3.5 07/07/2018   CL 103 07/07/2018   CO2 25 07/07/2018    Lab Results  Component Value Date   HGBA1C 5.1 07/08/2018       Component Value Date/Time   CHOL 272 (H) 07/07/2018 1903   TRIG 148 07/07/2018 1903   HDL 46 07/07/2018 1903   CHOLHDL 5.9 07/07/2018 1903   VLDL 30 07/07/2018 1903   LDLCALC 196 (H) 07/07/2018 1903        Assessment and plan:  1. Encounter to establish care 2. Essential hypertension, uncontrolled no medications -Clonidine 0.1 mg given. Advised to consistently take blood pressure medication. Unable to evaluate whether titration of BP medication is warranted given patient hasn't taken medication prior to visit. Recommend return for BP check and take medication. We have discussed target BP range and blood pressure goal. I have advised patient to check BP regularly and to call us  back or report to clinic if the numbers are consistently higher than 140/90. We discussed the importance of compliance with medical therapy and DASH diet recommended, consequences of uncontrolled hypertension discussed.   3. COPD with acute bronchitis (HCC) Prednisone taper 40 mg x 5 days. Start Albuterol inhaler 2 puffs every 4-6 hours as needed. Hycodan cough syrup 5 ml every 8 hours as needed for cough -Stop smoking    4. Elevated lipoprotein(a) Continue atorvastatin and recommended DASH diet.   5. BMI 40.0-44.9, adult (HCC) Encouraged efforts to reduce weight include engaging in physical activity as tolerated with goal of 150 minutes per week. Improve dietary choices and eat a meal regimen consistent with a Mediterranean or DASH diet. Reduce simple carbohydrates. Do not skip meals and eat healthy snacks throughout the day to avoid over-eating at dinner. Set a goal weight loss that is  achievable for you. Wellbutrin 150 mg once daily was prescribed for smoking cessation and weight loss.  6. Encounter for smoking cessation, patient verbalizes interest in quitting -Start Wellbutrin 150 mg once daily   Meds ordered this encounter  Medications  . predniSONE (DELTASONE) 20 MG tablet    Sig: Take 2 tablets (40 mg total) by mouth daily with breakfast.    Dispense:  10 tablet    Refill:  0  . HYDROcodone-homatropine (HYCODAN) 5-1.5 MG/5ML syrup    Sig: Take 5 mLs by mouth every 8 (eight) hours as needed for cough.    Dispense:  120 mL    Refill:  0  . albuterol (PROVENTIL HFA;VENTOLIN HFA) 108 (90 Base) MCG/ACT inhaler    Sig: Inhale 2 puffs into the lungs every 6 (six) hours as needed for wheezing or shortness of breath.    Dispense:  1 Inhaler    Refill:  0  . buPROPion (WELLBUTRIN XL) 150 MG 24 hr tablet    Sig: Take 1 tablet (150 mg total) by mouth daily.    Dispense:  30 tablet    Refill:  3    Patient was encouraged to return for BP check, due to transportation she advised she may not be able to return to our office prior to appointment. Encouraged to check BP at any retail store and  follow-up with me via phone if readings are greater than 140/90.  Return for 2 weeks BP check and 6 weeks HTN follow-up.   A total of 35 minutes spent, greater than 50 % of this time was spent counseling and coordination of care.   The patient was given clear instructions to go to ER or return to medical center if symptoms don't improve, worsen or new problems develop. The patient verbalized understanding. The patient was advised  to call and obtain lab results if they haven't heard anything from out office within 7-10 business days.  Joaquin Courts, FNP Primary Care at [facility] [address] [place] [address] [phone]: [phone]    This note has been created with Dragon speech recognition software and Paediatric nurse. Any  transcriptional errors are unintentional.

## 2018-07-19 NOTE — Patient Instructions (Addendum)
Thank you for choosing Primary Care at [facility] to be your medical home!    Rachael Calhoun was seen by Joaquin Courts, FNP today.   Rachael Calhoun primary care provider is Bing Neighbors, FNP.   For the best care possible, you should try to see Joaquin Courts, FNP-C whenever you come to the clinic.   We look forward to seeing you again soon!  If you have any questions about your visit today, please call us at [phone] or feel free to reach your primary care provider via MyChart.     Chronic Obstructive Pulmonary Disease Chronic obstructive pulmonary disease (COPD) is a long-term (chronic) lung problem. When you have COPD, it is hard for air to get in and out of your lungs. The way your lungs work will never return to normal. Usually the condition gets worse over time. There are things you can do to keep yourself as healthy as possible. Your doctor may treat your condition with:  Medicines.  Quitting smoking, if you smoke.  Rehabilitation. This may involve a team of specialists.  Oxygen.  Exercise and changes to your diet.  Lung surgery.  Comfort measures (palliative care).  Follow these instructions at home: Medicines  Take over-the-counter and prescription medicines only as told by your doctor.  Talk to your doctor before taking any cough or allergy medicines. You may need to avoid medicines that cause your lungs to be dry. Lifestyle  If you smoke, stop. Smoking makes the problem worse. If you need help quitting, ask your doctor.  Avoid being around things that make your breathing worse. This may include smoke, chemicals, and fumes.  Stay active, but remember to also rest.  Learn and use tips on how to relax.  Make sure you get enough sleep. Most adults need at least 7 hours a night.  Eat healthy foods. Eat smaller meals more often. Rest before meals. Controlled breathing  Learn and use tips on how to control your breathing as told by your doctor.  Try: ? Breathing in (inhaling) through your nose for 1 second. Then, pucker your lips and breath out (exhale) through your lips for 2 seconds. ? Putting one hand on your belly (abdomen). Breathe in slowly through your nose for 1 second. Your hand on your belly should move out. Pucker your lips and breathe out slowly through your lips. Your hand on your belly should move in as you breathe out. Controlled coughing  Learn and use controlled coughing to clear mucus from your lungs. The steps are: 1. Lean your head a little forward. 2. Breathe in deeply. 3. Try to hold your breath for 3 seconds. 4. Keep your mouth slightly open while coughing 2 times. 5. Spit any mucus out into a tissue. 6. Rest and do the steps again 1 or 2 times as needed. General instructions  Make sure you get all the shots (vaccines) that your doctor recommends. Ask your doctor about a flu shot and a pneumonia shot.  Use oxygen therapy and therapy to help improve your lungs (pulmonary rehabilitation) if told by your doctor. If you need home oxygen therapy, ask your doctor if you should buy a tool to measure your oxygen level (oximeter).  Make a COPD action plan with your doctor. This helps you know what to do if you feel worse than usual.  Manage any other conditions you have as told by your doctor.  Avoid going outside when it is very hot, cold, or humid.  Avoid people  who have a sickness you can catch (contagious).  Keep all follow-up visits as told by your doctor. This is important. Contact a doctor if:  You cough up more mucus than usual.  There is a change in the color or thickness of the mucus.  It is harder to breathe than usual.  Your breathing is faster than usual.  You have trouble sleeping.  You need to use your medicines more often than usual.  You have trouble doing your normal activities such as getting dressed or walking around the house. Get help right away if:  You have shortness of breath  while resting.  You have shortness of breath that stops you from: ? Being able to talk. ? Doing normal activities.  Your chest hurts for longer than 5 minutes.  Your skin color is more blue than usual.  Your pulse oximeter shows that you have low oxygen for longer than 5 minutes.  You have a fever.  You feel too tired to breathe normally. Summary  Chronic obstructive pulmonary disease (COPD) is a long-term lung problem.  The way your lungs work will never return to normal. Usually the condition gets worse over time. There are things you can do to keep yourself as healthy as possible.  Take over-the-counter and prescription medicines only as told by your doctor.  If you smoke, stop. Smoking makes the problem worse. This information is not intended to replace advice given to you by your health care provider. Make sure you discuss any questions you have with your health care provider. Document Released: 01/28/2008 Document Revised: 01/17/2016 Document Reviewed: 04/07/2013 Elsevier Interactive Patient Education  2017 [employer]

## 2018-07-20 ENCOUNTER — Telehealth: Payer: Self-pay | Admitting: Family Medicine

## 2018-07-20 NOTE — Telephone Encounter (Signed)
Wellbutrin was sent to pharmacy last night.

## 2018-07-20 NOTE — Telephone Encounter (Signed)
Patient called stating that provider forgot to prescribe her something to quit smoking cigarettes, patient would like med sent to [facility] on [address], please follow up

## 2018-07-28 ENCOUNTER — Telehealth: Payer: Self-pay | Admitting: Family Medicine

## 2018-07-28 NOTE — Telephone Encounter (Signed)
Patient came in stating that she would like more cough syrup because she states that she only received half of the prescription due to her not having enough money. Patient is also requesting her prescription of  buPROPion (WELLBUTRIN XL) 150 MG 24 hr tablet [[id number]][[id number]] be sent to [facility] [address] because [facility] [address] [place] for [place] and that she has gone to pick it up and it is not there. Please follow up.

## 2018-07-28 NOTE — Telephone Encounter (Signed)
Please advise 

## 2018-07-29 MED ORDER — BUPROPION HCL ER (XL) 150 MG PO TB24: 150.0000 mg | ORAL_TABLET | Freq: Every day | ORAL | 3 refills | Status: DC

## 2018-07-29 MED ORDER — HYDROCODONE-HOMATROPINE 5-1.5 MG/5ML PO SYRP: 5.0000 mL | ORAL_SOLUTION | Freq: Three times a day (TID) | ORAL | 0 refills | Status: DC | PRN

## 2018-07-29 NOTE — Telephone Encounter (Signed)
Please call patient to advise Wellbutrin is at the pharmacy and that the cough syrup prescription will have to be picked up because our electronic controlled substance application is not working properly. She can wait for this problem to be corrected which me be tomorrow or pickup the prescription for the Hycodan.

## 2018-08-02 ENCOUNTER — Other Ambulatory Visit: Payer: Self-pay | Admitting: Family Medicine

## 2018-08-02 MED ORDER — HYDROCODONE-HOMATROPINE 5-1.5 MG/5ML PO SYRP: 5.0000 mL | ORAL_SOLUTION | Freq: Three times a day (TID) | ORAL | 0 refills | Status: DC | PRN

## 2018-08-02 NOTE — Progress Notes (Unsigned)
Advise Rachael Calhoun to pickup medication at pharmacy

## 2018-08-04 NOTE — Telephone Encounter (Signed)
Patient called requesting refills of hydrochlorothiazide (MICROZIDE) 12.5 MG capsule [[id number]][[id number]] and atorvastatin (LIPITOR) 40 MG tablet [[id number]][[id number]]    Please follow up.

## 2018-08-06 ENCOUNTER — Other Ambulatory Visit: Payer: Self-pay

## 2018-08-06 MED ORDER — ATORVASTATIN CALCIUM 40 MG PO TABS: 40.0000 mg | ORAL_TABLET | Freq: Every day | ORAL | 1 refills | Status: DC

## 2018-08-06 MED ORDER — HYDROCHLOROTHIAZIDE 12.5 MG PO CAPS: 12.5000 mg | ORAL_CAPSULE | Freq: Every day | ORAL | 1 refills | Status: DC

## 2018-08-30 ENCOUNTER — Ambulatory Visit: Payer: Self-pay | Admitting: Family Medicine

## 2018-09-03 IMAGING — CR DG KNEE 1-2V*R*
2 series · 2 of 2 positions shown · non-contrast
Comparison: None.

CLINICAL DATA: LEFT arm and knee pain beginning 1 week ago, knee
gave out today. No injury.

EXAM:
RIGHT KNEE - 1-2 VIEW

[knee ap]
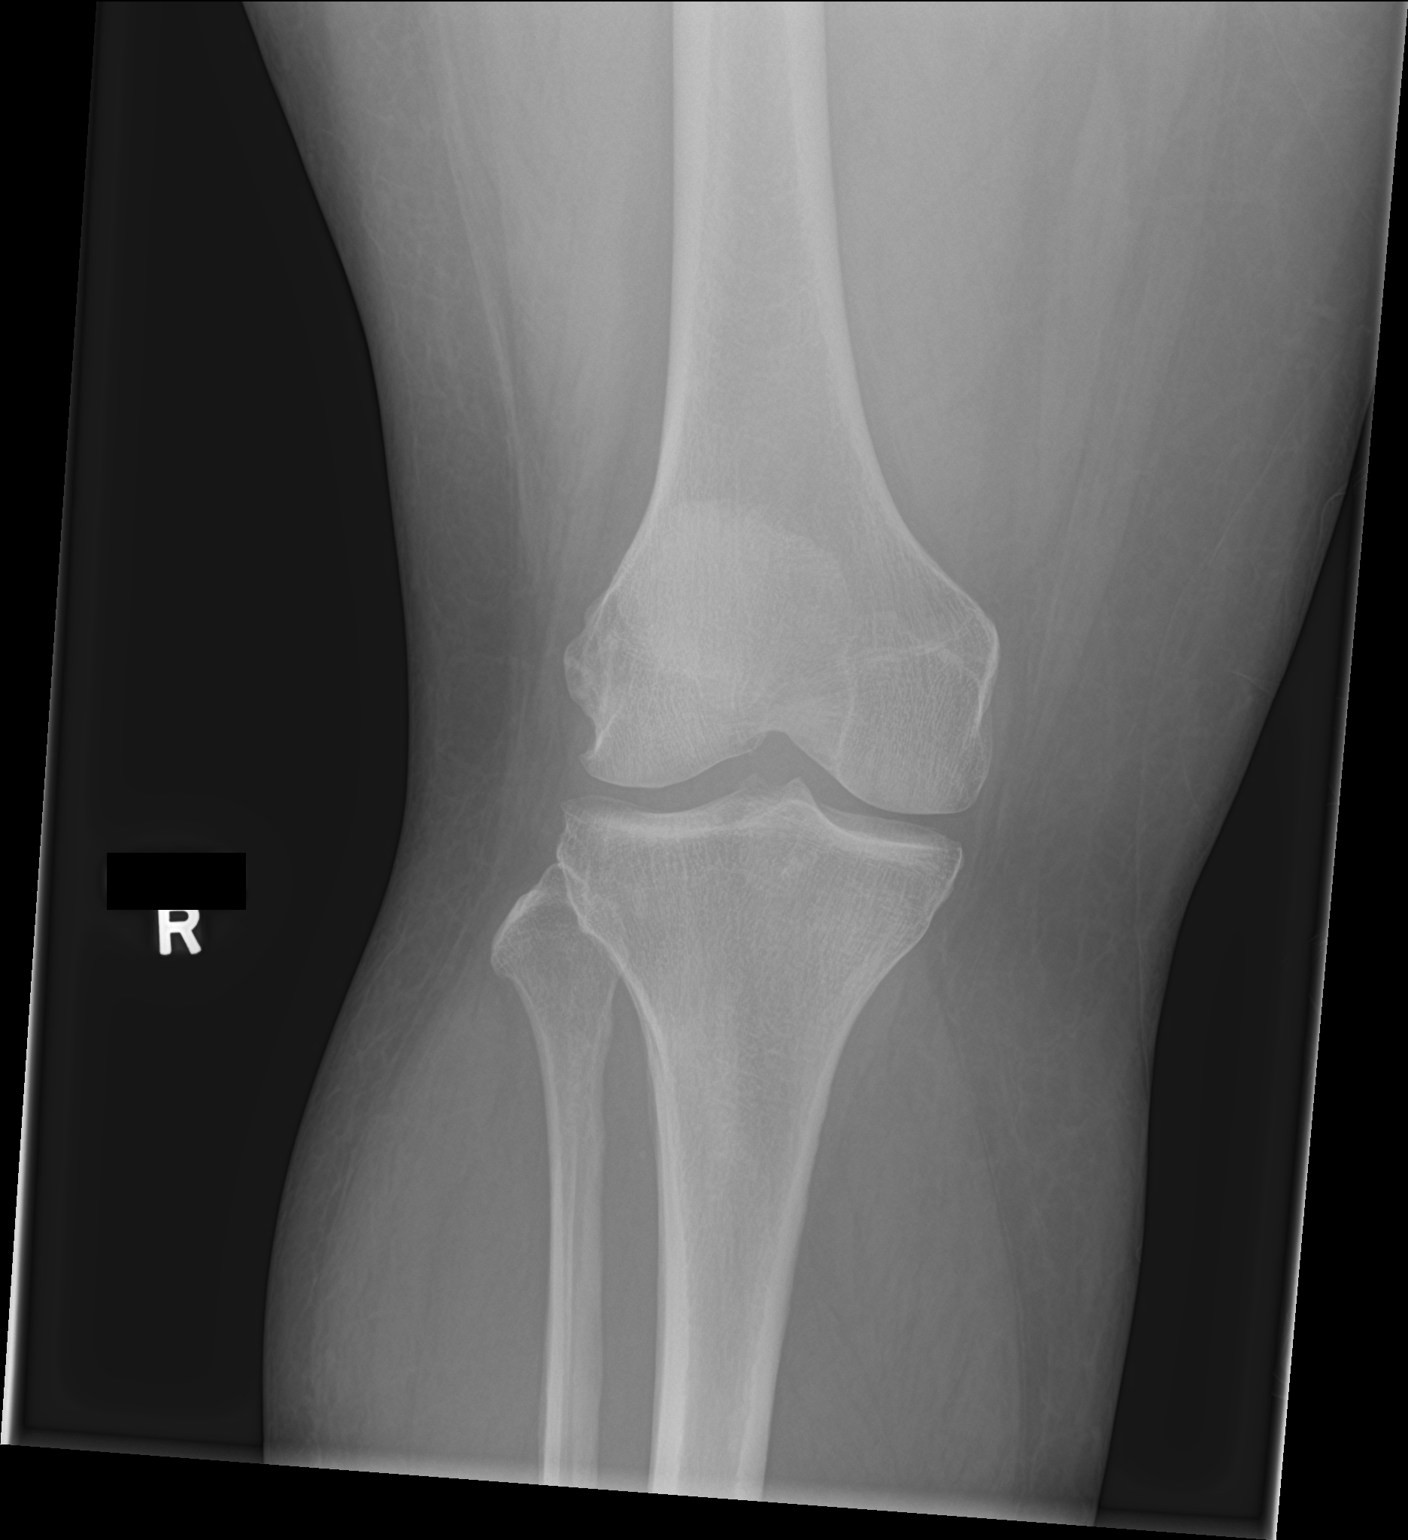

[knee lat]
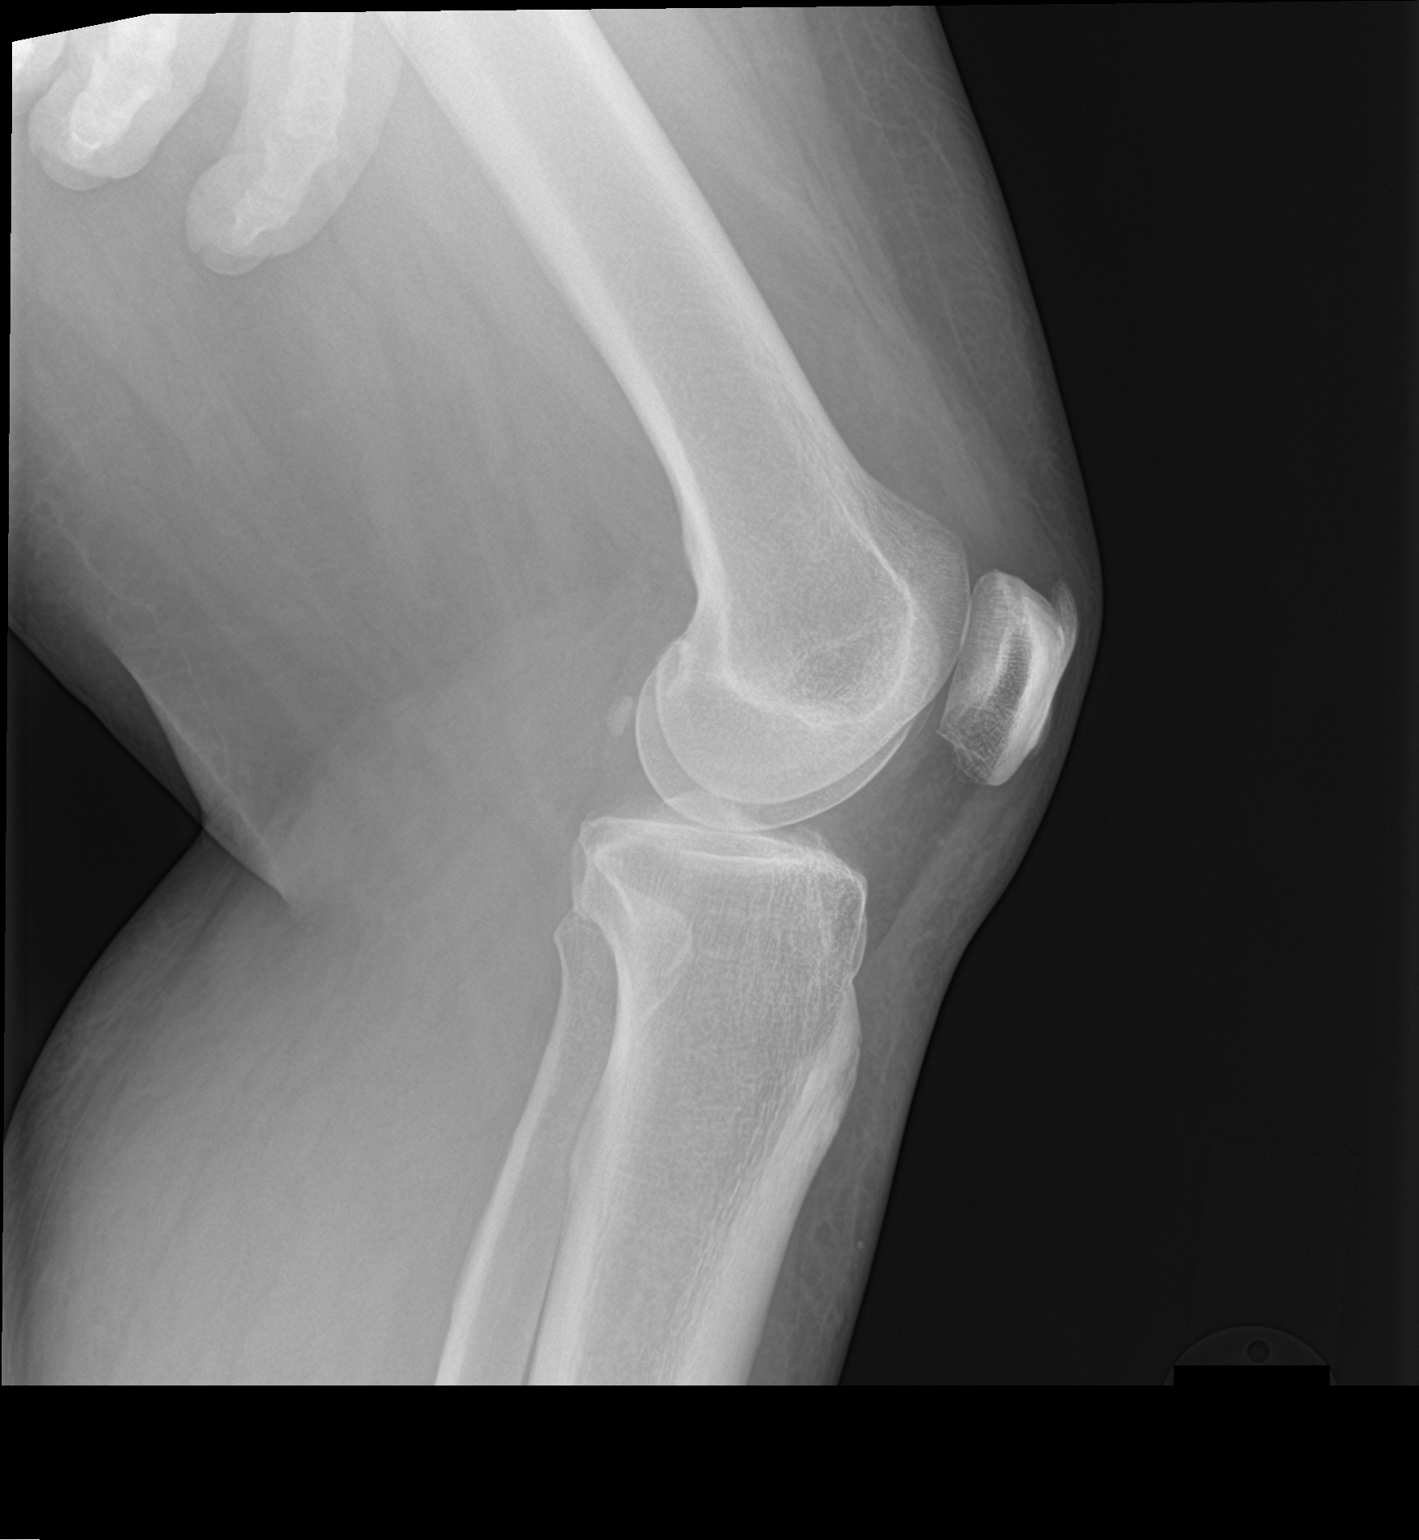

[2 of 2 positions shown; findings below may reference images not displayed]

FINDINGS: No acute fracture deformity or dislocation. Joint space intact
without erosions. Mild lateral compartment marginal spurring. No
destructive bony lesions. Soft tissue planes are not suspicious.
IMPRESSION: Early osteoarthrosis without fracture deformity or dislocation .

## 2018-09-03 IMAGING — CR DG SHOULDER 2+V*L*
3 series · 3 of 3 positions shown · non-contrast
Comparison: None.

CLINICAL DATA: Neck pain radiating to LEFT arm and knee pain
beginning 1 week ago, knee gave out today. No injury.

EXAM:
LEFT SHOULDER - 2+ VIEW

[shoulder grashey]
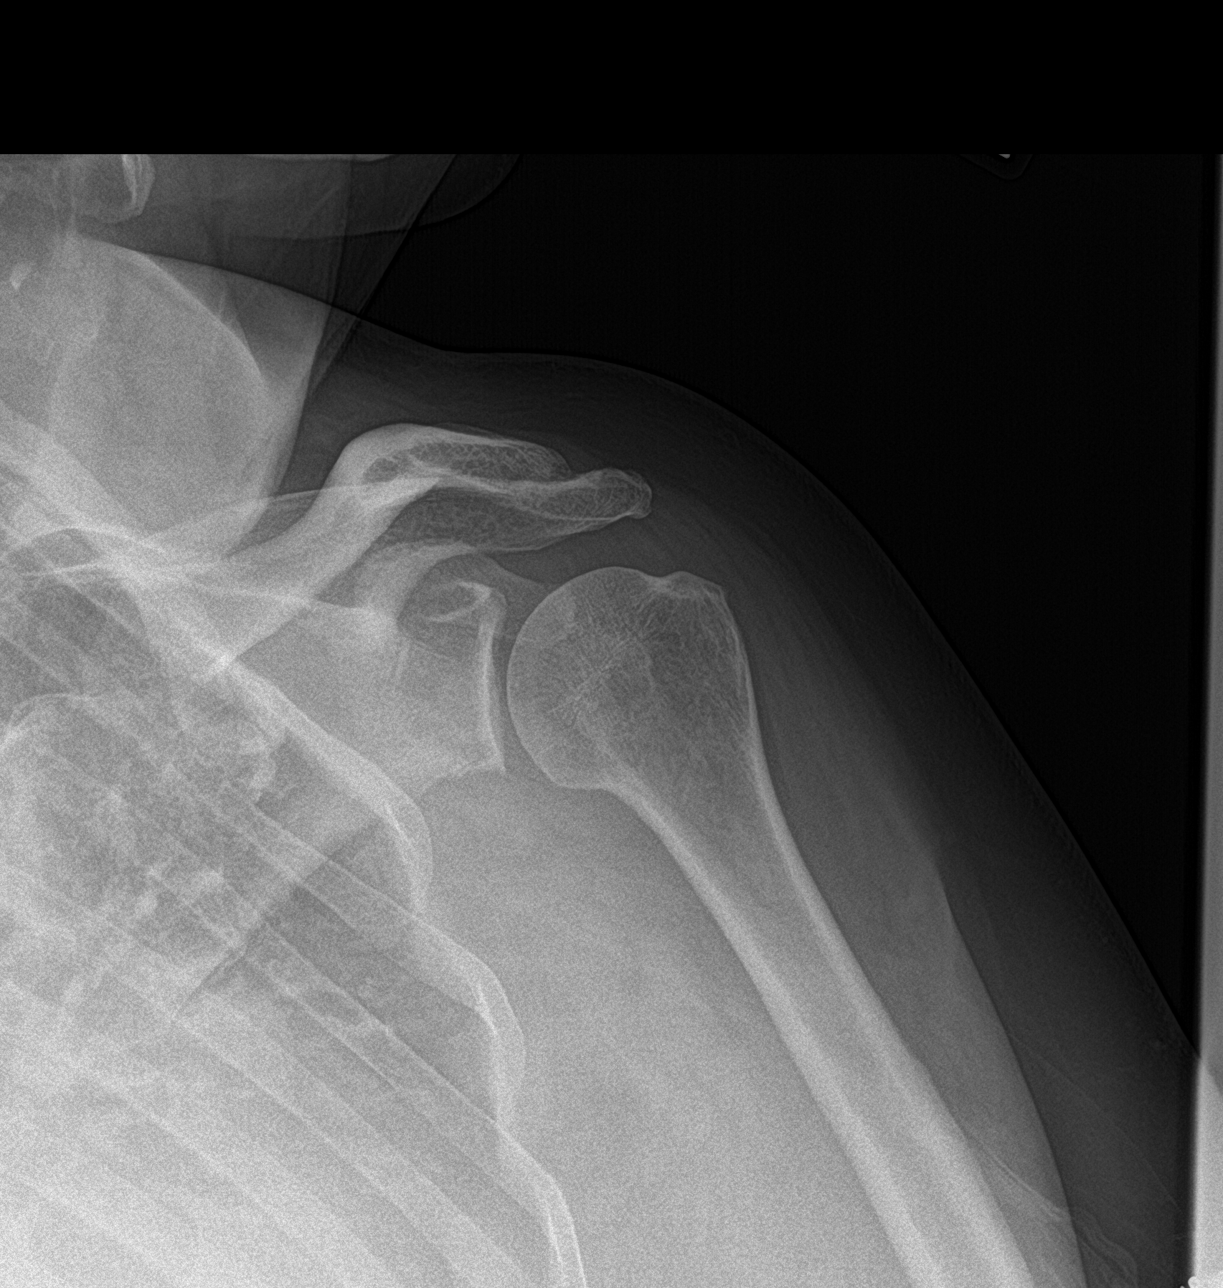

[shoulder y view]
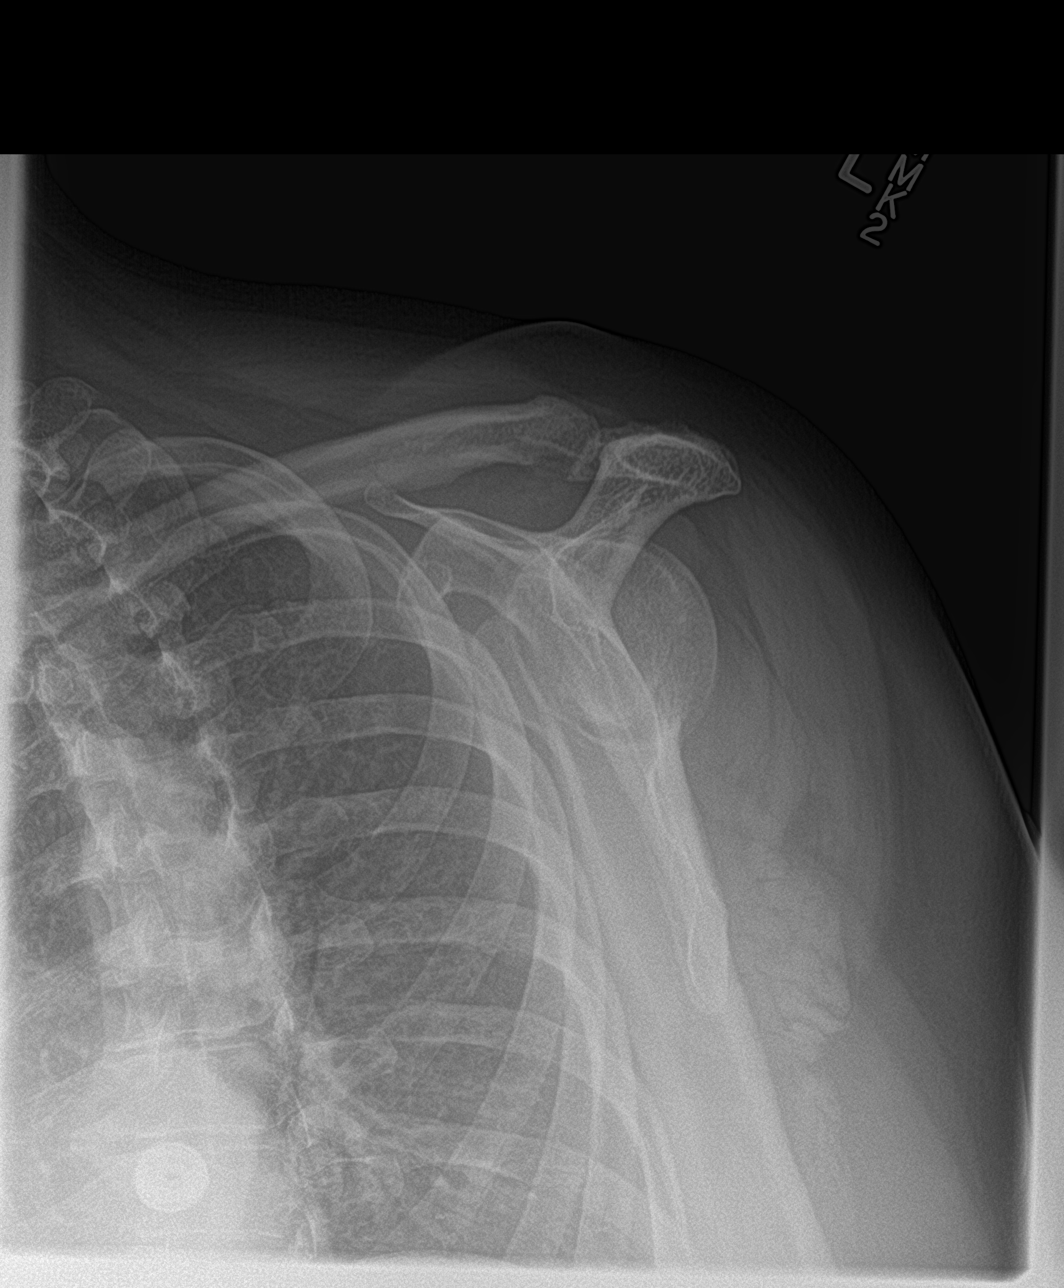

[shoulder axillary]
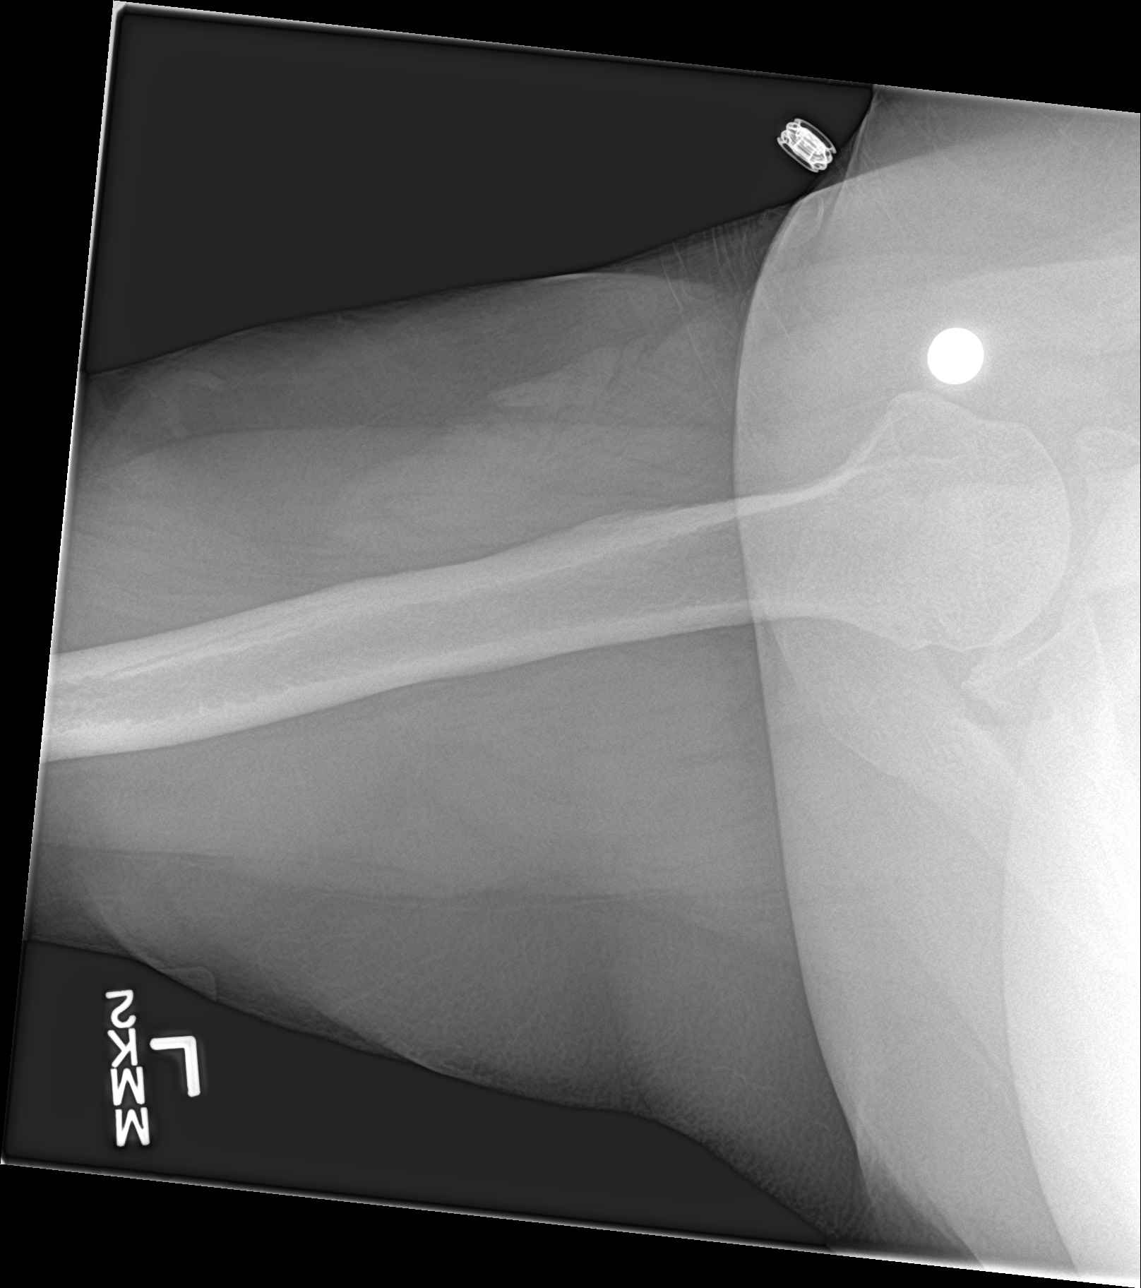

[3 of 3 positions shown; findings below may reference images not displayed]

FINDINGS: There is no evidence of fracture or dislocation. There is no
evidence of arthropathy or other focal bone abnormality. Soft
tissues are unremarkable.
IMPRESSION: Negative.

## 2018-09-13 ENCOUNTER — Encounter: Payer: Self-pay | Admitting: Family Medicine

## 2018-09-13 ENCOUNTER — Ambulatory Visit (INDEPENDENT_AMBULATORY_CARE_PROVIDER_SITE_OTHER): Payer: Self-pay | Admitting: Family Medicine

## 2018-09-13 VITALS — BP 142/92 | HR 95 | Resp 17 | Ht 61.0 in | Wt 231.8 lb

## 2018-09-13 DIAGNOSIS — I10 Essential (primary) hypertension: Secondary | ICD-10-CM

## 2018-09-13 DIAGNOSIS — Z23 Encounter for immunization: Secondary | ICD-10-CM

## 2018-09-13 DIAGNOSIS — F411 Generalized anxiety disorder: Secondary | ICD-10-CM

## 2018-09-13 DIAGNOSIS — Z1329 Encounter for screening for other suspected endocrine disorder: Secondary | ICD-10-CM

## 2018-09-13 DIAGNOSIS — E7841 Elevated Lipoprotein(a): Secondary | ICD-10-CM

## 2018-09-13 DIAGNOSIS — F17210 Nicotine dependence, cigarettes, uncomplicated: Secondary | ICD-10-CM

## 2018-09-13 MED ORDER — HYDROCHLOROTHIAZIDE 12.5 MG PO CAPS: 25.0000 mg | ORAL_CAPSULE | Freq: Every day | ORAL | 1 refills | Status: DC

## 2018-09-13 MED ORDER — BUPROPION HCL ER (XL) 150 MG PO TB24: 150.0000 mg | ORAL_TABLET | Freq: Every day | ORAL | 3 refills | Status: DC

## 2018-09-13 MED ORDER — ATORVASTATIN CALCIUM 40 MG PO TABS: 40.0000 mg | ORAL_TABLET | Freq: Every day | ORAL | 1 refills | Status: DC

## 2018-09-13 NOTE — Progress Notes (Signed)
Patient ID: Rachael Calhoun, female    DOB: June 24, 1966, 53 y.o.   MRN: [id number]  PCP: Bing Neighbors, FNP  Chief Complaint  Patient presents with  . Hypertension  . Hyperlipidemia    has been out for 1 month. unable to get the money to pick up.    Subjective:  HPI  Rachael Calhoun is a 53 y.o. female presents for evaluation hypertension and hyperlipidemia.  Rachael Calhoun has Atypical chest pain;Generalized Anxiety Disorder, Essential hypertension; Hyperlipemia; and Tobacco use disorder on their problem list.   Hypertension/Hyperlipidemia  Rachael Calhoun reports no consistent home monitoring of blood pressure, although checks it occasionally. She has had elevated readings in 190's/100's when she has missed a dose of blood pressure medication. She is having financial and transportation problems and has been unable to pick-up her cholesterol medication. She eats foods that are available regardless of sodium content Reports adherence to blood pressure medications, although has been unable to pick-up atorvastatin due to cost. She can't use [facility] due to transportation issues. Current Body mass index is 43.8 kg/m. She is inactive of routine physical activity. She is daily everyday smoker. Denies any episodes of dizziness, headaches, shortness of breath, or chest pain. Social History   Socioeconomic History  . Marital status: Legally Separated    Spouse name: Not on file  . Number of children: Not on file  . Years of education: Not on file  . Highest education level: Not on file  Occupational History  . Not on file  Social Needs  . Financial resource strain: Not on file  . Food insecurity:    Worry: Not on file    Inability: Not on file  . Transportation needs:    Medical: Not on file    Non-medical: Not on file  Tobacco Use  . Smoking status: Current Every Day Smoker    Packs/day: 0.50    Types: Cigarettes  . Smokeless tobacco: Never Used  Substance and Sexual Activity  .  Alcohol use: No  . Drug use: No  . Sexual activity: Yes  Lifestyle  . Physical activity:    Days per week: Not on file    Minutes per session: Not on file  . Stress: Not on file  Relationships  . Social connections:    Talks on phone: Not on file    Gets together: Not on file    Attends religious service: Not on file    Active member of club or organization: Not on file    Attends meetings of clubs or organizations: Not on file    Relationship status: Not on file  . Intimate partner violence:    Fear of current or ex partner: Not on file    Emotionally abused: Not on file    Physically abused: Not on file    Forced sexual activity: Not on file  Other Topics Concern  . Not on file  Social History Narrative  . Not on file    Family History  Problem Relation Age of Onset  . Diabetes Mother   . Cancer Mother   . Heart failure Father   . Hypertension Father      Review of Systems Pertinent negatives listed in HPI Patient Active Problem List   Diagnosis Date Noted  . Essential hypertension 07/08/2018  . Hyperlipemia 07/08/2018  . Tobacco use disorder 07/08/2018  . Atypical chest pain 07/07/2018    No Known Allergies  Prior to Admission medications   Medication Sig Start  Date End Date Taking? Authorizing Provider  aspirin EC 81 MG tablet Take 1 tablet (81 mg total) by mouth daily. 07/08/18 07/08/19 Yes Seawell, Jaimie A, DO  atorvastatin (LIPITOR) 40 MG tablet Take 1 tablet (40 mg total) by mouth daily. 09/13/18  Yes Bing NeighborsHarris, Leonides Minder S, FNP  buPROPion (WELLBUTRIN XL) 150 MG 24 hr tablet Take 1 tablet (150 mg total) by mouth daily. 07/29/18  Yes Bing NeighborsHarris, Beyonca Wisz S, FNP  hydrochlorothiazide (MICROZIDE) 12.5 MG capsule Take 2 capsules (25 mg total) by mouth daily. 09/13/18  Yes Bing NeighborsHarris, Ameria Sanjurjo S, FNP    Past Medical, Surgical Family and Social History reviewed and updated.    Objective:   Today's Vitals   09/13/18 1417  BP: (!) 142/92  Pulse: 95  Resp: 17  SpO2:  96%  Weight: 231 lb 12.8 oz (105.1 kg)  Height: 5\' 1"  (1.549 m)    Wt Readings from Last 3 Encounters:  09/13/18 231 lb 12.8 oz (105.1 kg)  07/19/18 237 lb 12.8 oz (107.9 kg)  07/07/18 250 lb (113.4 kg)     Physical Exam General appearance: alert, well developed, well nourished, cooperative and in no distress Head: Normocephalic, without obvious abnormality, atraumatic Respiratory: Respirations even and unlabored, normal respiratory rate Heart: rate and rhythm normal. No gallop or murmurs noted on exam  Extremities: No gross deformities Skin: Skin color, texture, turgor normal. No rashes seen  Psych: Anxious mood-patient attributes to family stress.Normal judgement. negative of suicidal/homidical  ideation Neurologic: Mental status: Alert, oriented to person, place, and time, thought content appropriate. No results found for: POCGLU  Lab Results  Component Value Date   HGBA1C 5.1 07/08/2018     Assessment & Plan:  1. Essential hypertension, not at goal today <140/90 Increase hydrochlorothiazide from 12.5 mg to 25 mg daily. If blood pressure continues to remain out of goal will add an ACE/ARB. Check in Comprehensive metabolic panel  2. Hyperlipidemia,  Resume atorvastatin as soon as possible. - Checking a fasting Lipid Panel  3. Thyroid disorder screen - TSH  4. Generalized Anxiety Disorder -continue Wellbutrin   Orders Placed This Encounter  Procedures  . Tdap vaccine greater than or equal to 7yo IM  . Lipid Panel  . TSH  . Comprehensive metabolic panel   Meds ordered this encounter  Medications  . hydrochlorothiazide (MICROZIDE) 12.5 MG capsule    Sig: Take 2 capsules (25 mg total) by mouth daily.    Dispense:  30 capsule    Refill:  1  . DISCONTD: atorvastatin (LIPITOR) 40 MG tablet    Sig: Take 1 tablet (40 mg total) by mouth daily.    Dispense:  30 tablet    Refill:  1  . buPROPion (WELLBUTRIN XL) 150 MG 24 hr tablet    Sig: Take 1 tablet (150 mg total)  by mouth daily.    Dispense:  30 tablet    Refill:  3  . atorvastatin (LIPITOR) 40 MG tablet    Sig: Take 2 tablets (80 mg total) by mouth daily.    Dispense:  60 tablet    Refill:  1      -The patient was given clear instructions to go to ER or return to medical center if symptoms do not improve, worsen or new problems develop. The patient verbalized understanding.    Rachael CourtsKimberly Mylez Venable, FNP [facility] [address], [place] [id number] [phone]: [phone]

## 2018-09-13 NOTE — Patient Instructions (Addendum)
Increase hydrochlorothiazide 25 mg, (2 tablets) Resume Atorvastatin Refilled Wellbutrin   I have sent refills to [facility]  [address] Rachael Calhoun, [place] [address] [phone]   Hypertension Hypertension, commonly called high blood pressure, is when the force of blood pumping through the arteries is too strong. The arteries are the blood vessels that carry blood from the heart throughout the body. Hypertension forces the heart to work harder to pump blood and may cause arteries to become narrow or stiff. Having untreated or uncontrolled hypertension can cause heart attacks, strokes, kidney disease, and other problems. A blood pressure reading consists of a higher number over a lower number. Ideally, your blood pressure should be below 120/80. The first ("top") number is called the systolic pressure. It is a measure of the pressure in your arteries as your heart beats. The second ("bottom") number is called the diastolic pressure. It is a measure of the pressure in your arteries as the heart relaxes. What are the causes? The cause of this condition is not known. What increases the risk? Some risk factors for high blood pressure are under your control. Others are not. Factors you can change  Smoking.  Having type 2 diabetes mellitus, high cholesterol, or both.  Not getting enough exercise or physical activity.  Being overweight.  Having too much fat, sugar, calories, or salt (sodium) in your diet.  Drinking too much alcohol. Factors that are difficult or impossible to change  Having chronic kidney disease.  Having a family history of high blood pressure.  Age. Risk increases with age.  Race. You may be at higher risk if you are African-American.  Gender. Men are at higher risk than women before age 82. After age 75, women are at higher risk than men.  Having obstructive sleep apnea.  Stress. What are the signs or  symptoms? Extremely high blood pressure (hypertensive crisis) may cause:  Headache.  Anxiety.  Shortness of breath.  Nosebleed.  Nausea and vomiting.  Severe chest pain.  Jerky movements you cannot control (seizures). How is this diagnosed? This condition is diagnosed by measuring your blood pressure while you are seated, with your arm resting on a surface. The cuff of the blood pressure monitor will be placed directly against the skin of your upper arm at the level of your heart. It should be measured at least twice using the same arm. Certain conditions can cause a difference in blood pressure between your right and left arms. Certain factors can cause blood pressure readings to be lower or higher than normal (elevated) for a short period of time:  When your blood pressure is higher when you are in a health care provider's office than when you are at home, this is called white coat hypertension. Most people with this condition do not need medicines.  When your blood pressure is higher at home than when you are in a health care provider's office, this is called masked hypertension. Most people with this condition may need medicines to control blood pressure. If you have a high blood pressure reading during one visit or you have normal blood pressure with other risk factors:  You may be asked to return on a different day to have your blood pressure checked again.  You may be asked to monitor your blood pressure at home for 1 week or longer. If you are diagnosed with hypertension, you may have other blood or imaging tests to help your health care provider  understand your overall risk for other conditions. How is this treated? This condition is treated by making healthy lifestyle changes, such as eating healthy foods, exercising more, and reducing your alcohol intake. Your health care provider may prescribe medicine if lifestyle changes are not enough to get your blood pressure under  control, and if:  Your systolic blood pressure is above 130.  Your diastolic blood pressure is above 80. Your personal target blood pressure may vary depending on your medical conditions, your age, and other factors. Follow these instructions at home: Eating and drinking   Eat a diet that is high in fiber and potassium, and low in sodium, added sugar, and fat. An example eating plan is called the DASH (Dietary Approaches to Stop Hypertension) diet. To eat this way: ? Eat plenty of fresh fruits and vegetables. Try to fill half of your plate at each meal with fruits and vegetables. ? Eat whole grains, such as whole wheat pasta, brown rice, or whole grain bread. Fill about one quarter of your plate with whole grains. ? Eat or drink low-fat dairy products, such as skim milk or low-fat yogurt. ? Avoid fatty cuts of meat, processed or cured meats, and poultry with skin. Fill about one quarter of your plate with lean proteins, such as fish, chicken without skin, beans, eggs, and tofu. ? Avoid premade and processed foods. These tend to be higher in sodium, added sugar, and fat.  Reduce your daily sodium intake. Most people with hypertension should eat less than 1,500 mg of sodium a day.  Limit alcohol intake to no more than 1 drink a day for nonpregnant women and 2 drinks a day for men. One drink equals 12 oz of beer, 5 oz of wine, or 1 oz of hard liquor. Lifestyle   Work with your health care provider to maintain a healthy body weight or to lose weight. Ask what an ideal weight is for you.  Get at least 30 minutes of exercise that causes your heart to beat faster (aerobic exercise) most days of the week. Activities may include walking, swimming, or biking.  Include exercise to strengthen your muscles (resistance exercise), such as pilates or lifting weights, as part of your weekly exercise routine. Try to do these types of exercises for 30 minutes at least 3 days a week.  Do not use any  products that contain nicotine or tobacco, such as cigarettes and e-cigarettes. If you need help quitting, ask your health care provider.  Monitor your blood pressure at home as told by your health care provider.  Keep all follow-up visits as told by your health care provider. This is important. Medicines  Take over-the-counter and prescription medicines only as told by your health care provider. Follow directions carefully. Blood pressure medicines must be taken as prescribed.  Do not skip doses of blood pressure medicine. Doing this puts you at risk for problems and can make the medicine less effective.  Ask your health care provider about side effects or reactions to medicines that you should watch for. Contact a health care provider if:  You think you are having a reaction to a medicine you are taking.  You have headaches that keep coming back (recurring).  You feel dizzy.  You have swelling in your ankles.  You have trouble with your vision. Get help right away if:  You develop a severe headache or confusion.  You have unusual weakness or numbness.  You feel faint.  You have severe pain in  your chest or abdomen.  You vomit repeatedly.  You have trouble breathing. Summary  Hypertension is when the force of blood pumping through your arteries is too strong. If this condition is not controlled, it may put you at risk for serious complications.  Your personal target blood pressure may vary depending on your medical conditions, your age, and other factors. For most people, a normal blood pressure is less than 120/80.  Hypertension is treated with lifestyle changes, medicines, or a combination of both. Lifestyle changes include weight loss, eating a healthy, low-sodium diet, exercising more, and limiting alcohol. This information is not intended to replace advice given to you by your health care provider. Make sure you discuss any questions you have with your health care  provider. Document Released: 08/11/2005 Document Revised: 07/09/2016 Document Reviewed: 07/09/2016 Elsevier Interactive Patient Education  2019 [employer].

## 2018-09-14 LAB — COMPREHENSIVE METABOLIC PANEL
ALBUMIN: 4.6 g/dL (ref 3.8–4.9)
ALK PHOS: 122 IU/L — AB (ref 39–117)
ALT: 16 IU/L (ref 0–32)
AST: 17 IU/L (ref 0–40)
Albumin/Globulin Ratio: 1.7 (ref 1.2–2.2)
BILIRUBIN TOTAL: 0.7 mg/dL (ref 0.0–1.2)
BUN / CREAT RATIO: 14 (ref 9–23)
BUN: 11 mg/dL (ref 6–24)
CHLORIDE: 95 mmol/L — AB (ref 96–106)
CO2: 25 mmol/L (ref 20–29)
Calcium: 10 mg/dL (ref 8.7–10.2)
Creatinine, Ser: 0.81 mg/dL (ref 0.57–1.00)
GFR calc Af Amer: 97 mL/min/{1.73_m2} (ref >59)
GFR calc non Af Amer: 84 mL/min/{1.73_m2} (ref >59)
GLOBULIN, TOTAL: 2.7 g/dL (ref 1.5–4.5)
Glucose: 90 mg/dL (ref 65–99)
Potassium: 4.1 mmol/L (ref 3.5–5.2)
SODIUM: 141 mmol/L (ref 134–144)
Total Protein: 7.3 g/dL (ref 6.0–8.5)

## 2018-09-14 LAB — LIPID PANEL
CHOLESTEROL TOTAL: 308 mg/dL — AB (ref 100–199)
Chol/HDL Ratio: 6.6 ratio — ABNORMAL HIGH (ref 0.0–4.4)
HDL: 47 mg/dL (ref >39)
LDL CALC: 226 mg/dL — AB (ref 0–99)
TRIGLYCERIDES: 176 mg/dL — AB (ref 0–149)
VLDL CHOLESTEROL CAL: 35 mg/dL (ref 5–40)

## 2018-09-14 LAB — TSH: TSH: 1.29 u[IU]/mL (ref 0.450–4.500)

## 2018-09-18 MED ORDER — ATORVASTATIN CALCIUM 40 MG PO TABS: 80.0000 mg | ORAL_TABLET | Freq: Every day | ORAL | 1 refills | Status: DC

## 2018-09-24 ENCOUNTER — Telehealth: Payer: Self-pay | Admitting: Family Medicine

## 2018-09-24 ENCOUNTER — Ambulatory Visit: Payer: Self-pay | Admitting: Cardiology

## 2018-09-24 NOTE — Telephone Encounter (Signed)
Patient called requesting lab results please follow up °

## 2018-09-27 NOTE — Progress Notes (Signed)
Patient notified of results & recommendations. Expressed understanding. Is scheduled for May 2020. Hasn't been able to pick up statin medication.

## 2018-09-29 ENCOUNTER — Other Ambulatory Visit: Payer: Self-pay | Admitting: Family Medicine

## 2018-09-29 NOTE — Telephone Encounter (Signed)
Patient seen at [facility]- sent for PCP review of refill request.

## 2018-11-26 ENCOUNTER — Telehealth: Payer: Self-pay | Admitting: Family Medicine

## 2018-11-26 MED ORDER — ALBUTEROL SULFATE HFA 108 (90 BASE) MCG/ACT IN AERS: 2.0000 | INHALATION_SPRAY | Freq: Four times a day (QID) | RESPIRATORY_TRACT | 1 refills | Status: DC | PRN

## 2018-11-26 NOTE — Telephone Encounter (Signed)
Rx sent 

## 2018-11-26 NOTE — Telephone Encounter (Signed)
Pt called because she wanted a refill on  albuterol (PROVENTIL HFA;VENTOLIN HFA) 108 (90 Base) MCG/ACT inhaler [[id number]], and she wanted it be sent to the [facility] on [facility]

## 2019-01-12 ENCOUNTER — Ambulatory Visit: Payer: Self-pay | Admitting: Family Medicine

## 2019-03-16 ENCOUNTER — Telehealth: Payer: Self-pay | Admitting: Family Medicine

## 2019-03-16 NOTE — Telephone Encounter (Signed)
Patient called to request a refill on their medication, they were instructed to hang up and contact their pharmacy directly to order their prescriptions even if they are out of refills.  

## 2019-03-22 ENCOUNTER — Telehealth: Payer: Self-pay

## 2019-03-22 NOTE — Telephone Encounter (Signed)

## 2019-03-23 ENCOUNTER — Ambulatory Visit: Payer: Self-pay | Admitting: Nurse Practitioner

## 2019-03-24 ENCOUNTER — Other Ambulatory Visit: Payer: Self-pay | Admitting: Family Medicine

## 2019-05-03 ENCOUNTER — Other Ambulatory Visit: Payer: Self-pay | Admitting: Primary Care

## 2019-05-03 ENCOUNTER — Other Ambulatory Visit: Payer: Self-pay | Admitting: Family Medicine

## 2019-05-03 NOTE — Telephone Encounter (Signed)
Patient hasn't been seen since January 2020. Once an appointment is made a 30 day Rx can be sent.

## 2019-05-03 NOTE — Telephone Encounter (Signed)
1) Medication(s) Requested (by name): hydrochlorothiazide (MICROZIDE) 12.5 MG capsule [[id number]]   2) Pharmacy of Choice: [facility], [address] [place]    Approved medications will be sent to pharmacy, we will reach out to you if there is an issue.  Requests made after 3pm may not be addressed until following business day!

## 2019-05-06 MED ORDER — HYDROCHLOROTHIAZIDE 12.5 MG PO CAPS: 12.5000 mg | ORAL_CAPSULE | Freq: Every day | ORAL | 0 refills | Status: DC

## 2019-05-06 NOTE — Addendum Note (Signed)
Addended by: Carylon Perches on: 05/06/2019 10:26 AM   Modules accepted: Orders

## 2019-05-06 NOTE — Telephone Encounter (Signed)
Patient called in this morning & was transferred to me. Explained to patient that she needs an appointment prior to refills since she hasn't been seen since January. Patient made an appointment for 05/25/2019 @ 10:50 AM.

## 2019-05-24 ENCOUNTER — Telehealth: Payer: Self-pay

## 2019-05-24 NOTE — Telephone Encounter (Signed)
Called patient to do their pre-visit COVID screening.  Call went to voicemail. Unable to do prescreening.  

## 2019-05-25 ENCOUNTER — Other Ambulatory Visit: Payer: Self-pay

## 2019-05-25 ENCOUNTER — Ambulatory Visit (INDEPENDENT_AMBULATORY_CARE_PROVIDER_SITE_OTHER): Payer: Self-pay | Admitting: Nurse Practitioner

## 2019-05-25 VITALS — BP 165/116 | HR 81 | Temp 97.3°F | Resp 17 | Ht 61.0 in | Wt 233.8 lb

## 2019-05-25 DIAGNOSIS — M2550 Pain in unspecified joint: Secondary | ICD-10-CM

## 2019-05-25 DIAGNOSIS — F17210 Nicotine dependence, cigarettes, uncomplicated: Secondary | ICD-10-CM

## 2019-05-25 DIAGNOSIS — F17200 Nicotine dependence, unspecified, uncomplicated: Secondary | ICD-10-CM

## 2019-05-25 DIAGNOSIS — I10 Essential (primary) hypertension: Secondary | ICD-10-CM

## 2019-05-25 DIAGNOSIS — R7989 Other specified abnormal findings of blood chemistry: Secondary | ICD-10-CM

## 2019-05-25 DIAGNOSIS — E782 Mixed hyperlipidemia: Secondary | ICD-10-CM

## 2019-05-25 DIAGNOSIS — D72829 Elevated white blood cell count, unspecified: Secondary | ICD-10-CM

## 2019-05-25 DIAGNOSIS — E559 Vitamin D deficiency, unspecified: Secondary | ICD-10-CM

## 2019-05-25 DIAGNOSIS — R945 Abnormal results of liver function studies: Secondary | ICD-10-CM

## 2019-05-25 MED ORDER — ALBUTEROL SULFATE HFA 108 (90 BASE) MCG/ACT IN AERS: 2.0000 | INHALATION_SPRAY | Freq: Four times a day (QID) | RESPIRATORY_TRACT | 2 refills | Status: DC | PRN

## 2019-05-25 MED ORDER — BUPROPION HCL ER (SR) 150 MG PO TB12: 150.0000 mg | ORAL_TABLET | Freq: Two times a day (BID) | ORAL | 3 refills | Status: DC

## 2019-05-25 MED ORDER — CLONIDINE HCL 0.2 MG PO TABS
0.2000 mg | ORAL_TABLET | Freq: Every day | ORAL | Status: AC
  Administered 2019-05-25: 11:00:00 0.2 mg via ORAL

## 2019-05-25 MED ORDER — LOSARTAN POTASSIUM-HCTZ 100-25 MG PO TABS: 1.0000 | ORAL_TABLET | Freq: Every day | ORAL | 3 refills | Status: DC

## 2019-05-25 MED ORDER — MELOXICAM 7.5 MG PO TABS: 7.5000 mg | ORAL_TABLET | Freq: Every day | ORAL | 0 refills | Status: DC

## 2019-05-25 MED ORDER — PRAVASTATIN SODIUM 40 MG PO TABS: 40.0000 mg | ORAL_TABLET | Freq: Every day | ORAL | 3 refills | Status: DC

## 2019-05-25 NOTE — Progress Notes (Signed)
Assessment & Plan:  Rachael Calhoun was seen today for hypertension and hyperlipidemia.  Diagnoses and all orders for this visit:  Essential hypertension -     Lipid Panel -     cloNIDine (CATAPRES) tablet 0.2 mg -     losartan-hydrochlorothiazide (HYZAAR) 100-25 MG tablet; Take 1 tablet by mouth daily. Continue all antihypertensives as prescribed.  Remember to bring in your blood pressure log with you for your follow up appointment.  DASH/Mediterranean Diets are healthier choices for HTN.   Vitamin D deficiency -     VITAMIN D 25 Hydroxy (Vit-D Deficiency, Fractures)  Elevated LFTs -     CMP14+EGFR  Leukocytosis, unspecified type -     CBC with Differential  Tobacco use disorder -     albuterol (VENTOLIN HFA) 108 (90 Base) MCG/ACT inhaler; Inhale 2 puffs into the lungs every 6 (six) hours as needed for wheezing or shortness of breath. -     buPROPion (WELLBUTRIN SR) 150 MG 12 hr tablet; Take 1 tablet (150 mg total) by mouth 2 (two) times daily. [place] continues to smoke 1/2 pack of cigarettes per day. 2. Rachael Calhoun was counseled on the dangers of tobacco use, and was advised to quit. We reviewed specific strategies to maximize success, including removing cigarettes and smoking materials from environment, stress management and support of family/friends as well as pharmacological alternatives. 3. A total of 4 minutes was spent on counseling for smoking cessation and Rachael Calhoun is ready to quit and has chosen [facility]  to start today.  4. Rachael Calhoun was offered Wellbutrin, Chantix, Nicotine patch, Nicotine gum or lozenges.  Due to out of pocket costs Rachael Calhoun was also given smoking cessation support and advised to contact: the Smoking Cessation hotline: 1-800-QUIT-NOW.  Rachael Calhoun was also informed of our Smoking cessation classes which are also available through [facility] by calling [phone] or visit our website at [email or web address]  5. Will follow up at next scheduled office  visit.   Mixed hyperlipidemia -     pravastatin (PRAVACHOL) 40 MG tablet; Take 1 tablet (40 mg total) by mouth at bedtime. INSTRUCTIONS: Work on a low fat, heart healthy diet and participate in regular aerobic exercise program by working out at least 150 minutes per week; 5 days a week-30 minutes per day. Avoid red meat, fried foods. junk foods, sodas, sugary drinks, unhealthy snacking, alcohol and smoking.  Drink at least 48oz of water per day and monitor your carbohydrate intake daily.    Arthralgia of multiple joints -     meloxicam (MOBIC) 7.5 MG tablet; Take 1 tablet (7.5 mg total) by mouth daily. Work on losing weight to help reduce joint pain. May alternate with heat and ice application for pain relief. May also alternate with acetaminophen as prescribed pain relief. Other alternatives include massage, acupuncture and water aerobics.  You must stay active and avoid a sedentary lifestyle.    Patient has been counseled on age-appropriate routine health concerns for screening and prevention. These are reviewed and up-to-date. Referrals have been placed accordingly. Immunizations are up-to-date or declined.    Subjective:   Chief Complaint  Patient presents with  . Hypertension  . Hyperlipidemia   HPI Rachael Calhoun 53 y.o. female presents to office today for follow up.   has a past medical history of Anxiety, Arthritis, Breast abscess, Hyperlipidemia, Hypertension, and Hypertension.   She is overdue for mammogram. Will refer to the breast clinic for scheduling.   Essential Hypertension Chronic and poorly  controlled. She continues to smoke. She was prescribed Wellbutrin XL in the past however it does not appear to have been the Wellbutrin that is usually prescribed for smoking cessation. Will fill Wellbutrin SR today. She required 0.31m of clonidine today for her blood pressure. She endorses medication compliance taking HCTZ 12.5 mg daily.  Will start Hyzaar 100-25 mg daily today.  Denies chest pain, shortness of breath, palpitations, lightheadedness, dizziness, headaches or BLE edema.  BP Readings from Last 3 Encounters:  05/25/19 (!) 165/116  09/13/18 (!) 142/92  07/19/18 (!) 166/93   Joint/Muscle Pain Patient complains of arthralgias for which has been present for several yearsPain is located in multiple joints and including her low back and bilateral legs and feet, is described as aching, and is intermittent .  Associated symptoms include: decreased range of motion.  The patient has tried OTC NSAIDs with little relief. Related to injury:   no.  Hyperlipidemia Patient presents for follow up to poorly controlled hyperlipidemia.  She is not consistently medication compliant. She is not diet compliant and denies  statin intolerance including myalgias.  Lab Results  Component Value Date   CHOL 294 (H) 05/25/2019   Lab Results  Component Value Date   HDL 47 05/25/2019   Lab Results  Component Value Date   LDLCALC 206 (H) 05/25/2019   Lab Results  Component Value Date   TRIG 213 (H) 05/25/2019   Lab Results  Component Value Date   CHOLHDL 6.3 (H) 05/25/2019   Review of Systems  Constitutional: Negative for fever, malaise/fatigue and weight loss.  HENT: Negative.  Negative for nosebleeds.   Eyes: Negative.  Negative for blurred vision, double vision and photophobia.  Respiratory: Negative.  Negative for cough and shortness of breath.   Cardiovascular: Negative.  Negative for chest pain, palpitations and leg swelling.  Gastrointestinal: Negative.  Negative for heartburn, nausea and vomiting.  Musculoskeletal: Positive for back pain and joint pain. Negative for myalgias.  Neurological: Negative.  Negative for dizziness, focal weakness, seizures and headaches.  Psychiatric/Behavioral: Negative for suicidal ideas. The patient is nervous/anxious.     Past Medical History:  Diagnosis Date  . Anxiety   . Arthritis   . Breast abscess    rt breast  .  Hyperlipidemia   . Hypertension   . Hypertension     Past Surgical History:  Procedure Laterality Date  . Cyst removed from L breast    . INCISION AND DRAINAGE ABSCESS Right 10/08/2015   Procedure: INCISION AND DRAINAGE RIGHT BREAST ABSCESS;  Surgeon: LArta BruceKinsinger, MD;  Location: [facility];  Service: General;  Laterality: Right;  . Surgery on L wrist as a child    . TUBAL LIGATION      Family History  Problem Relation Age of Onset  . Diabetes Mother   . Cancer Mother   . Heart failure Father   . Hypertension Father     Social History Reviewed with no changes to be made today.   Outpatient Medications Prior to Visit  Medication Sig Dispense Refill  . aspirin EC 81 MG tablet Take 1 tablet (81 mg total) by mouth daily. 30 tablet 0  . albuterol (PROVENTIL HFA;VENTOLIN HFA) 108 (90 Base) MCG/ACT inhaler Inhale 2 puffs into the lungs every 6 (six) hours as needed for wheezing or shortness of breath. 1 Inhaler 1  . buPROPion (WELLBUTRIN XL) 150 MG 24 hr tablet Take 1 tablet by mouth once daily 30 tablet 0  . hydrochlorothiazide (MICROZIDE) 12.5  MG capsule Take 1 capsule (12.5 mg total) by mouth daily. 30 capsule 0  . atorvastatin (LIPITOR) 40 MG tablet Take 2 tablets (80 mg total) by mouth daily. 60 tablet 1   No facility-administered medications prior to visit.     No Known Allergies     Objective:    BP (!) 165/116   Pulse 81   Temp (!) 97.3 F (36.3 C) (Temporal)   Resp 17   Ht 5' 1"  (1.549 m)   Wt 233 lb 12.8 oz (106.1 kg)   SpO2 97%   BMI 44.18 kg/m  Wt Readings from Last 3 Encounters:  05/25/19 233 lb 12.8 oz (106.1 kg)  09/13/18 231 lb 12.8 oz (105.1 kg)  07/19/18 237 lb 12.8 oz (107.9 kg)    Physical Exam Vitals signs and nursing note reviewed.  Constitutional:      Appearance: She is well-developed.  HENT:     Head: Normocephalic and atraumatic.  Neck:     Musculoskeletal: Normal range of motion.  Cardiovascular:     Rate and Rhythm: Normal rate  and regular rhythm.     Heart sounds: Normal heart sounds. No murmur. No friction rub. No gallop.   Pulmonary:     Effort: Pulmonary effort is normal. No tachypnea or respiratory distress.     Breath sounds: Normal breath sounds. No decreased breath sounds, wheezing, rhonchi or rales.  Chest:     Chest wall: No tenderness.  Abdominal:     General: Bowel sounds are normal.     Palpations: Abdomen is soft.  Musculoskeletal: Normal range of motion.     Right lower leg: No edema.     Left lower leg: No edema.  Skin:    General: Skin is warm and dry.  Neurological:     Mental Status: She is alert and oriented to person, place, and time.     Coordination: Coordination normal.  Psychiatric:        Behavior: Behavior normal. Behavior is cooperative.        Thought Content: Thought content normal.        Judgment: Judgment normal.        Patient has been counseled extensively about nutrition and exercise as well as the importance of adherence with medications and regular follow-up. The patient was given clear instructions to go to ER or return to medical center if symptoms don't improve, worsen or new problems develop. The patient verbalized understanding.   Follow-up: Return in about 3 weeks (around 06/15/2019) for BP recheck/wellbutrin.   Gildardo Pounds, FNP-BC [facility] [place], [place]   05/28/2019, 7:58 PM

## 2019-05-25 NOTE — Progress Notes (Signed)
Stopped taking the Lipitor due to nausea, vomiting, abdominal pain, fatigue.   Denies chest pain, SHOB, headaches, palpitations, dizziness, lower extremity swelling

## 2019-05-26 LAB — CMP14+EGFR
ALT: 20 IU/L (ref 0–32)
AST: 17 IU/L (ref 0–40)
Albumin/Globulin Ratio: 1.7 (ref 1.2–2.2)
Albumin: 4.3 g/dL (ref 3.8–4.9)
Alkaline Phosphatase: 114 IU/L (ref 39–117)
BUN/Creatinine Ratio: 9 (ref 9–23)
BUN: 8 mg/dL (ref 6–24)
Bilirubin Total: 0.3 mg/dL (ref 0.0–1.2)
CO2: 29 mmol/L (ref 20–29)
Calcium: 9.9 mg/dL (ref 8.7–10.2)
Chloride: 101 mmol/L (ref 96–106)
Creatinine, Ser: 0.88 mg/dL (ref 0.57–1.00)
GFR calc Af Amer: 87 mL/min/{1.73_m2} (ref >59)
GFR calc non Af Amer: 76 mL/min/{1.73_m2} (ref >59)
Globulin, Total: 2.5 g/dL (ref 1.5–4.5)
Glucose: 97 mg/dL (ref 65–99)
Potassium: 3.9 mmol/L (ref 3.5–5.2)
Sodium: 144 mmol/L (ref 134–144)
Total Protein: 6.8 g/dL (ref 6.0–8.5)

## 2019-05-26 LAB — CBC WITH DIFFERENTIAL/PLATELET
Basophils Absolute: 0.1 10*3/uL (ref 0.0–0.2)
Basos: 1 %
EOS (ABSOLUTE): 0.2 10*3/uL (ref 0.0–0.4)
Eos: 2 %
Hematocrit: 48.9 % — ABNORMAL HIGH (ref 34.0–46.6)
Hemoglobin: 16.5 g/dL — ABNORMAL HIGH (ref 11.1–15.9)
Immature Grans (Abs): 0 10*3/uL (ref 0.0–0.1)
Immature Granulocytes: 0 %
Lymphocytes Absolute: 2.2 10*3/uL (ref 0.7–3.1)
Lymphs: 24 %
MCH: 31.4 pg (ref 26.6–33.0)
MCHC: 33.7 g/dL (ref 31.5–35.7)
MCV: 93 fL (ref 79–97)
Monocytes Absolute: 0.5 10*3/uL (ref 0.1–0.9)
Monocytes: 5 %
Neutrophils Absolute: 6.1 10*3/uL (ref 1.4–7.0)
Neutrophils: 68 %
Platelets: 324 10*3/uL (ref 150–450)
RBC: 5.25 x10E6/uL (ref 3.77–5.28)
RDW: 13.1 % (ref 11.7–15.4)
WBC: 9 10*3/uL (ref 3.4–10.8)

## 2019-05-26 LAB — LIPID PANEL
Chol/HDL Ratio: 6.3 ratio — ABNORMAL HIGH (ref 0.0–4.4)
Cholesterol, Total: 294 mg/dL — ABNORMAL HIGH (ref 100–199)
HDL: 47 mg/dL (ref >39)
LDL Chol Calc (NIH): 206 mg/dL — ABNORMAL HIGH (ref 0–99)
Triglycerides: 213 mg/dL — ABNORMAL HIGH (ref 0–149)
VLDL Cholesterol Cal: 41 mg/dL — ABNORMAL HIGH (ref 5–40)

## 2019-05-26 LAB — VITAMIN D 25 HYDROXY (VIT D DEFICIENCY, FRACTURES): Vit D, 25-Hydroxy: 11.1 ng/mL — ABNORMAL LOW (ref 30.0–100.0)

## 2019-05-28 ENCOUNTER — Encounter: Payer: Self-pay | Admitting: Nurse Practitioner

## 2019-05-28 MED ORDER — VITAMIN D (ERGOCALCIFEROL) 1.25 MG (50000 UNIT) PO CAPS: 50000.0000 [IU] | ORAL_CAPSULE | ORAL | 1 refills | Status: DC

## 2019-06-03 NOTE — Progress Notes (Signed)
Patient notified of results & recommendations. Expressed understanding.

## 2019-06-20 ENCOUNTER — Ambulatory Visit: Payer: Self-pay

## 2019-06-21 ENCOUNTER — Telehealth: Payer: Self-pay

## 2019-06-21 NOTE — Telephone Encounter (Signed)
Called patient to do their pre-visit COVID screening.  Call went to voicemail. Unable to do prescreening.  

## 2019-06-22 ENCOUNTER — Other Ambulatory Visit: Payer: Self-pay

## 2019-06-22 ENCOUNTER — Ambulatory Visit (INDEPENDENT_AMBULATORY_CARE_PROVIDER_SITE_OTHER): Payer: Self-pay | Admitting: Nurse Practitioner

## 2019-06-22 VITALS — BP 158/129 | HR 79 | Temp 97.3°F | Resp 17 | Wt 234.6 lb

## 2019-06-22 DIAGNOSIS — I10 Essential (primary) hypertension: Secondary | ICD-10-CM

## 2019-06-22 DIAGNOSIS — F17210 Nicotine dependence, cigarettes, uncomplicated: Secondary | ICD-10-CM

## 2019-06-22 DIAGNOSIS — E559 Vitamin D deficiency, unspecified: Secondary | ICD-10-CM

## 2019-06-22 DIAGNOSIS — F339 Major depressive disorder, recurrent, unspecified: Secondary | ICD-10-CM

## 2019-06-22 DIAGNOSIS — E782 Mixed hyperlipidemia: Secondary | ICD-10-CM

## 2019-06-22 DIAGNOSIS — M2550 Pain in unspecified joint: Secondary | ICD-10-CM

## 2019-06-22 DIAGNOSIS — J449 Chronic obstructive pulmonary disease, unspecified: Secondary | ICD-10-CM

## 2019-06-22 DIAGNOSIS — F17200 Nicotine dependence, unspecified, uncomplicated: Secondary | ICD-10-CM

## 2019-06-22 MED ORDER — PRAVASTATIN SODIUM 40 MG PO TABS: 40.0000 mg | ORAL_TABLET | Freq: Every day | ORAL | 3 refills | Status: DC

## 2019-06-22 MED ORDER — MELOXICAM 7.5 MG PO TABS: 7.5000 mg | ORAL_TABLET | Freq: Every day | ORAL | 0 refills | Status: DC

## 2019-06-22 MED ORDER — AMLODIPINE BESYLATE 10 MG PO TABS: 10.0000 mg | ORAL_TABLET | Freq: Every day | ORAL | 1 refills | Status: DC

## 2019-06-22 MED ORDER — LOSARTAN POTASSIUM-HCTZ 100-25 MG PO TABS: 1.0000 | ORAL_TABLET | Freq: Every day | ORAL | 3 refills | Status: DC

## 2019-06-22 MED ORDER — VITAMIN D (ERGOCALCIFEROL) 1.25 MG (50000 UNIT) PO CAPS: 50000.0000 [IU] | ORAL_CAPSULE | ORAL | 1 refills | Status: DC

## 2019-06-22 MED ORDER — ALBUTEROL SULFATE HFA 108 (90 BASE) MCG/ACT IN AERS: 2.0000 | INHALATION_SPRAY | Freq: Four times a day (QID) | RESPIRATORY_TRACT | 2 refills | Status: DC | PRN

## 2019-06-22 MED ORDER — BUPROPION HCL ER (SR) 150 MG PO TB12: 150.0000 mg | ORAL_TABLET | Freq: Two times a day (BID) | ORAL | 3 refills | Status: DC

## 2019-06-22 MED FILL — PRAVASTATIN NA 40 MG TAB: 40 | 30 days supply | Qty: 30 | Fill #0

## 2019-06-22 MED FILL — !VENTOLIN HFA INHALER: 108 (90 BAS | 25 days supply | Qty: 18 | Fill #0

## 2019-06-22 MED FILL — VIT D2 1.25 MG (50,000 UNIT: 1.25 MG | 84 days supply | Qty: 12 | Fill #0

## 2019-06-22 MED FILL — AMLODIPINE BESYLATE 10 MG T: 10 | 30 days supply | Qty: 30 | Fill #0

## 2019-06-22 MED FILL — LOSARTAN-HCTZ 100-25 MG TAB: 100-25 | 30 days supply | Qty: 30 | Fill #0

## 2019-06-22 MED FILL — BUPROPION SR 150 MG TABLET: 150 | 30 days supply | Qty: 60 | Fill #0

## 2019-06-22 MED FILL — MELOXICAM 7.5 MG TABLET: 7.5 | 30 days supply | Qty: 30 | Fill #0

## 2019-06-22 NOTE — Patient Instructions (Signed)
[place], [place] [address]

## 2019-06-22 NOTE — Progress Notes (Signed)
Assessment & Plan:  Rachael Calhoun was seen today for hypertension.  Diagnoses and all orders for this visit:  Essential hypertension -     amLODipine (NORVASC) 10 MG tablet; Take 1 tablet (10 mg total) by mouth daily. -     losartan-hydrochlorothiazide (HYZAAR) 100-25 MG tablet; Take 1 tablet by mouth daily. Continue all antihypertensives as prescribed.  Remember to bring in your blood pressure log with you for your follow up appointment.  DASH/Mediterranean Diets are healthier choices for HTN.   Tobacco use disorder States the wellbutrin makes the cigarettes taste terrible but she is continuing to smoke due to stress.  -     buPROPion (WELLBUTRIN SR) 150 MG 12 hr tablet; Take 1 tablet (150 mg total) by mouth 2 (two) times daily. -     albuterol (VENTOLIN HFA) 108 (90 Base) MCG/ACT inhaler; Inhale 2 puffs into the lungs every 6 (six) hours as needed for wheezing or shortness of breath. Rachael Calhoun was counseled on the dangers of tobacco use, and was advised to quit. Reviewed strategies to maximize success, including removing cigarettes and smoking materials from environment, stress management and support of family/friends as well as pharmacological alternatives including: Wellbutrin, Chantix, Nicotine patch, Nicotine gum or lozenges. Smoking cessation support: smoking cessation hotline: 1-800-QUIT-NOW.  Smoking cessation classes are also available through [facility]. Call [phone] or visit our website at [email or web address]   A total of 3 minutes was spent on counseling for smoking cessation and Rachael Calhoun is not ready to quit.   Mixed hyperlipidemia -     pravastatin (PRAVACHOL) 40 MG tablet; Take 1 tablet (40 mg total) by mouth at bedtime. INSTRUCTIONS: Work on a low fat, heart healthy diet and participate in regular aerobic exercise program by working out at least 150 minutes per week; 5 days a week-30 minutes per day. Avoid red meat/beef/steak,  fried foods. junk foods,  sodas, sugary drinks, unhealthy snacking, alcohol and smoking.  Drink at least 80 oz of water per day and monitor your carbohydrate intake daily.   Arthralgia of multiple joints -     meloxicam (MOBIC) 7.5 MG tablet; Take 1 tablet (7.5 mg total) by mouth daily. Work on losing weight to help reduce joint pain. May alternate with heat and ice application for pain relief. May also alternate with acetaminophen as prescribed pain relief. Other alternatives include massage, acupuncture and water aerobics.  You must stay active and avoid a sedentary lifestyle.  Vitamin D deficiency -     Vitamin D, Ergocalciferol, (DRISDOL) 1.25 MG (50000 UT) CAPS capsule; Take 1 capsule (50,000 Units total) by mouth every 7 (seven) days.  COPD with chronic bronchitis (HCC) -     albuterol (VENTOLIN HFA) 108 (90 Base) MCG/ACT inhaler; Inhale 2 puffs into the lungs every 6 (six) hours as needed for wheezing or shortness of breath. Still smoking!!! Encouraged to [place]     Patient has been counseled on age-appropriate routine health concerns for screening and prevention. These are reviewed and up-to-date. Referrals have been placed accordingly. Immunizations are up-to-date or declined.    Subjective:   Chief Complaint  Patient presents with  . Hypertension   HPI Rachael Calhoun 53 y.o. female presents to office today for follow up.  has a past medical history of Anxiety, Arthritis, Breast abscess, Hyperlipidemia, Hypertension, and Hypertension.  Essential Hypertension Not well controlled. She continues to smoke. Not monitoring her blood pressure at home. Taking hyzaar 100-25 mg daily as prescribed. Will start amlodipine 10  mg today. Denies chest pain, palpitations, lightheadedness, dizziness, headaches or BLE edema.  BP Readings from Last 3 Encounters:  06/22/19 (!) 158/129  05/25/19 (!) 165/116  09/13/18 (!) 142/92    Hyperlipidemia Patient presents for follow up to hyperlipidemia.  She is not medication  compliant and states she has not been able to pick up her cholesterol medication pravastatin due to cost. I explained the process of how applying for the financial aid program works. She is not diet compliant and denies statin intolerance including myalgias.  Lab Results  Component Value Date   CHOL 294 (H) 05/25/2019   Lab Results  Component Value Date   HDL 47 05/25/2019   Lab Results  Component Value Date   LDLCALC 206 (H) 05/25/2019   Lab Results  Component Value Date   TRIG 213 (H) 05/25/2019   Lab Results  Component Value Date   CHOLHDL 6.3 (H) 05/25/2019    Depression Well controlled with Wellbutrin. She does endorse increased stress however mood lability is stable.  Depression screen [facility] 2/9 06/22/2019 05/25/2019 07/30/2018  Decreased Interest 2 1 0  Down, Depressed, Hopeless 0 0 0  PHQ - 2 Score 2 1 0  Altered sleeping 0 0 1  Tired, decreased energy 1 2 1   Change in appetite 0 0 2  Feeling bad or failure about yourself  0 0 0  Trouble concentrating 0 0 0  Moving slowly or fidgety/restless 0 0 0  Suicidal thoughts 0 0 0  PHQ-9 Score 3 3 4    Review of Systems  Constitutional: Negative for fever, malaise/fatigue and weight loss.  HENT: Negative.  Negative for nosebleeds.   Eyes: Negative.  Negative for blurred vision, double vision and photophobia.  Respiratory: Positive for shortness of breath (mild with exertion; uses inhaler). Negative for cough and wheezing.   Cardiovascular: Negative.  Negative for chest pain, palpitations and leg swelling.  Gastrointestinal: Negative.  Negative for heartburn, nausea and vomiting.  Musculoskeletal: Positive for joint pain. Negative for myalgias.  Neurological: Negative.  Negative for dizziness, focal weakness, seizures and headaches.  Psychiatric/Behavioral: Positive for depression. Negative for suicidal ideas.    Past Medical History:  Diagnosis Date  . Anxiety   . Arthritis   . Breast abscess    rt breast  .  Hyperlipidemia   . Hypertension   . Hypertension     Past Surgical History:  Procedure Laterality Date  . Cyst removed from L breast    . INCISION AND DRAINAGE ABSCESS Right 10/08/2015   Procedure: INCISION AND DRAINAGE RIGHT BREAST ABSCESS;  Surgeon: Kinsinger, MD;  Location: [facility];  Service: General;  Laterality: Right;  . Surgery on L wrist as a child    . TUBAL LIGATION      Family History  Problem Relation Age of Onset  . Diabetes Mother   . Cancer Mother   . Heart failure Father   . Hypertension Father     Social History Reviewed with no changes to be made today.   Outpatient Medications Prior to Visit  Medication Sig Dispense Refill  . aspirin EC 81 MG tablet Take 1 tablet (81 mg total) by mouth daily. 30 tablet 0  . albuterol (VENTOLIN HFA) 108 (90 Base) MCG/ACT inhaler Inhale 2 puffs into the lungs every 6 (six) hours as needed for wheezing or shortness of breath. 18 g 2  . buPROPion (WELLBUTRIN SR) 150 MG 12 hr tablet Take 1 tablet (150 mg total) by mouth 2 (  two) times daily. 60 tablet 3  . losartan-hydrochlorothiazide (HYZAAR) 100-25 MG tablet Take 1 tablet by mouth daily. 90 tablet 3  . meloxicam (MOBIC) 7.5 MG tablet Take 1 tablet (7.5 mg total) by mouth daily. 30 tablet 0  . pravastatin (PRAVACHOL) 40 MG tablet Take 1 tablet (40 mg total) by mouth at bedtime. 90 tablet 3  . Vitamin D, Ergocalciferol, (DRISDOL) 1.25 MG (50000 UT) CAPS capsule Take 1 capsule (50,000 Units total) by mouth every 7 (seven) days. 12 capsule 1   No facility-administered medications prior to visit.     No Known Allergies     Objective:    BP (!) 158/129   Pulse 79   Temp (!) 97.3 F (36.3 C) (Temporal)   Resp 17   Wt 234 lb 9.6 oz (106.4 kg)   SpO2 97%   BMI 44.33 kg/m  Wt Readings from Last 3 Encounters:  06/22/19 234 lb 9.6 oz (106.4 kg)  05/25/19 233 lb 12.8 oz (106.1 kg)  09/13/18 231 lb 12.8 oz (105.1 kg)    Physical Exam Vitals signs and nursing note  reviewed.  Constitutional:      Appearance: She is well-developed.  HENT:     Head: Normocephalic and atraumatic.  Neck:     Musculoskeletal: Normal range of motion.  Cardiovascular:     Rate and Rhythm: Normal rate and regular rhythm.     Heart sounds: Normal heart sounds. No murmur. No friction rub. No gallop.   Pulmonary:     Effort: Pulmonary effort is normal. No tachypnea or respiratory distress.     Breath sounds: Normal breath sounds. No decreased breath sounds, wheezing, rhonchi or rales.  Chest:     Chest wall: No tenderness.  Abdominal:     General: Bowel sounds are normal.     Palpations: Abdomen is soft.  Musculoskeletal: Normal range of motion.  Skin:    General: Skin is warm and dry.  Neurological:     Mental Status: She is alert and oriented to person, place, and time.     Coordination: Coordination normal.  Psychiatric:        Behavior: Behavior normal. Behavior is cooperative.        Thought Content: Thought content normal.        Judgment: Judgment normal.          Patient has been counseled extensively about nutrition and exercise as well as the importance of adherence with medications and regular follow-up. The patient was given clear instructions to go to ER or return to medical center if symptoms don't improve, worsen or new problems develop. The patient verbalized understanding.   Follow-up: Return in about 3 weeks (around 07/13/2019) for BP recheck w/provider.   Claiborne RiggZelda W Martasia Talamante, FNP-BC [facility] [place], [place] [phone]   06/22/2019, 10:22 AM

## 2019-06-22 NOTE — Progress Notes (Signed)
Patient here for BP follow up. Has taken BP medication this morning. Denies chest pain, SHOB, lower extremity swelling, palpitations, dizziness, headaches.

## 2019-07-19 ENCOUNTER — Telehealth: Payer: Self-pay

## 2019-07-19 NOTE — Telephone Encounter (Signed)
Called patient to do their pre-visit COVID screening.  Call went to voicemail. Unable to do prescreening.  

## 2019-07-20 ENCOUNTER — Ambulatory Visit: Payer: Self-pay

## 2019-08-09 ENCOUNTER — Telehealth: Payer: Self-pay

## 2019-08-09 NOTE — Telephone Encounter (Signed)
Called patient to do their pre-visit COVID screening.  Call went to voicemail. Unable to do prescreening.  

## 2019-08-10 ENCOUNTER — Other Ambulatory Visit: Payer: Self-pay

## 2019-08-10 ENCOUNTER — Ambulatory Visit (INDEPENDENT_AMBULATORY_CARE_PROVIDER_SITE_OTHER): Payer: Self-pay | Admitting: Nurse Practitioner

## 2019-08-10 DIAGNOSIS — J449 Chronic obstructive pulmonary disease, unspecified: Secondary | ICD-10-CM

## 2019-08-10 DIAGNOSIS — F17210 Nicotine dependence, cigarettes, uncomplicated: Secondary | ICD-10-CM

## 2019-08-10 DIAGNOSIS — I10 Essential (primary) hypertension: Secondary | ICD-10-CM

## 2019-08-10 MED ORDER — HYDROCHLOROTHIAZIDE 25 MG PO TABS: 25.0000 mg | ORAL_TABLET | Freq: Every day | ORAL | 3 refills | Status: DC

## 2019-08-10 MED ORDER — AMLODIPINE BESYLATE 10 MG PO TABS: 10.0000 mg | ORAL_TABLET | Freq: Every day | ORAL | 1 refills | Status: DC

## 2019-08-10 MED ORDER — FLUTICASONE-SALMETEROL 250-50 MCG/DOSE IN AEPB: 1.0000 | INHALATION_SPRAY | Freq: Two times a day (BID) | RESPIRATORY_TRACT | 3 refills | Status: DC

## 2019-08-10 MED FILL — AMLODIPINE BESYLATE 10 MG T: 10 | 30 days supply | Qty: 30 | Fill #0

## 2019-08-10 MED FILL — !ADVAIR 250/50 DISKUS: 250-50 | 30 days supply | Qty: 60 | Fill #0

## 2019-08-10 MED FILL — HYDROCHLOROTHIAZIDE 25 MG T: 25 | 30 days supply | Qty: 30 | Fill #0

## 2019-08-10 NOTE — Progress Notes (Signed)
Virtual Visit via Telephone Note Due to national recommendations of social distancing due to COVID 19, telehealth visit is felt to be most appropriate for this patient at this time.  I discussed the limitations, risks, security and privacy concerns of performing an evaluation and management service by telephone and the availability of in person appointments. I also discussed with the patient that there may be a patient responsible charge related to this service. The patient expressed understanding and agreed to proceed.    I connected with Rachael Calhoun on 08/10/19  at   1:30 PM EST  EDT by telephone and verified that I am speaking with the correct person using two identifiers.   Consent I discussed the limitations, risks, security and privacy concerns of performing an evaluation and management service by telephone and the availability of in person appointments. I also discussed with the patient that there may be a patient responsible charge related to this service. The patient expressed understanding and agreed to proceed.   Location of Patient: Private  Residence   Location of Provider: [facility]    Persons participating in Telemedicine visit: Bertram Denver FNP-BC YY Keene CMA Lupita Leash Jorden    History of Present Illness: Telemedicine visit for: HTN  Essential Hypertension I started her on Losartan-HCTZ 100-25mg  over 3 months ago however today she states she stopped taking it as she felt like her throat was closing up recently. She continues on amlodipine 10 mg daily and HCTZ. Not monitoring her blood pressures at home. If blood pressure continues elevated will need to start additional agent. She has been on lisinopril-hctz 20-25 mg daily in the past but can not recall why it was discontinued. Denies chest pain,  palpitations, lightheadedness, dizziness, headaches or BLE edema.  BP Readings from Last 3 Encounters:  06/22/19 (!) 158/129  05/25/19 (!)  165/116  09/13/18 (!) 142/92     COPD Persistent use of albuterol inhaler 4 times already today. Coughing and wheezing. Denies productive cough of purulent sputum. Still smoking!   Past Medical History:  Diagnosis Date  . Anxiety   . Arthritis   . Breast abscess    rt breast  . Hyperlipidemia   . Hypertension   . Hypertension     Past Surgical History:  Procedure Laterality Date  . Cyst removed from L breast    . INCISION AND DRAINAGE ABSCESS Right 10/08/2015   Procedure: INCISION AND DRAINAGE RIGHT BREAST ABSCESS;  Surgeon: De Blanch Kinsinger, MD;  Location: [facility];  Service: General;  Laterality: Right;  . Surgery on L wrist as a child    . TUBAL LIGATION      Family History  Problem Relation Age of Onset  . Diabetes Mother   . Cancer Mother   . Heart failure Father   . Hypertension Father     Social History   Socioeconomic History  . Marital status: Legally Separated    Spouse name: Not on file  . Number of children: Not on file  . Years of education: Not on file  . Highest education level: Not on file  Occupational History  . Not on file  Tobacco Use  . Smoking status: Current Every Day Smoker    Packs/day: 0.50    Types: Cigarettes  . Smokeless tobacco: Never Used  Substance and Sexual Activity  . Alcohol use: No  . Drug use: No  . Sexual activity: Yes  Other Topics Concern  . Not on file  Social History  Narrative  . Not on file   Social Determinants of Health   Financial Resource Strain:   . Difficulty of Paying Living Expenses: Not on file  Food Insecurity:   . Worried About Charity fundraiser in the Last Year: Not on file  . Ran Out of Food in the Last Year: Not on file  Transportation Needs:   . Lack of Transportation (Medical): Not on file  . Lack of Transportation (Non-Medical): Not on file  Physical Activity:   . Days of Exercise per Week: Not on file  . Minutes of Exercise per Session: Not on file  Stress:   . Feeling of Stress :  Not on file  Social Connections:   . Frequency of Communication with Friends and Family: Not on file  . Frequency of Social Gatherings with Friends and Family: Not on file  . Attends Religious Services: Not on file  . Active Member of Clubs or Organizations: Not on file  . Attends Archivist Meetings: Not on file  . Marital Status: Not on file     Observations/Objective: Awake, alert and oriented x 3   Review of Systems  Constitutional: Negative for fever, malaise/fatigue and weight loss.  HENT: Negative.  Negative for nosebleeds.   Eyes: Negative.  Negative for blurred vision, double vision and photophobia.  Respiratory: Positive for cough, shortness of breath and wheezing.   Cardiovascular: Negative.  Negative for chest pain, palpitations and leg swelling.  Gastrointestinal: Negative.  Negative for heartburn, nausea and vomiting.  Musculoskeletal: Negative.  Negative for myalgias.  Neurological: Negative.  Negative for dizziness, focal weakness, seizures and headaches.  Psychiatric/Behavioral: Negative.  Negative for suicidal ideas.    Assessment and Plan: [name] was seen today for hypertension.  Diagnoses and all orders for this visit:  Essential hypertension -     amLODipine (NORVASC) 10 MG tablet; Take 1 tablet (10 mg total) by mouth daily. -     hydrochlorothiazide (HYDRODIURIL) 25 MG tablet; Take 1 tablet (25 mg total) by mouth daily. Continue all antihypertensives as prescribed.  Remember to bring in your blood pressure log with you for your follow up appointment.  DASH/Mediterranean Diets are healthier choices for HTN.   COPD with chronic bronchitis (HCC) -     Fluticasone-Salmeterol (ADVAIR DISKUS) 250-50 MCG/DOSE AEPB; Inhale 1 puff into the lungs 2 (two) times daily.     Follow Up Instructions Return in about 2 weeks (around 08/24/2019) for BP recheck/NURSE.     I discussed the assessment and treatment plan with the patient. The patient was provided  an opportunity to ask questions and all were answered. The patient agreed with the plan and demonstrated an understanding of the instructions.   The patient was advised to call back or seek an in-person evaluation if the symptoms worsen or if the condition fails to improve as anticipated.  I provided 19 minutes of non-face-to-face time during this encounter including median intraservice time, reviewing previous notes, labs, imaging, medications and explaining diagnosis and management.  Gildardo Pounds, FNP-BC

## 2019-08-15 ENCOUNTER — Other Ambulatory Visit (HOSPITAL_COMMUNITY): Payer: Self-pay | Admitting: *Deleted

## 2019-08-15 DIAGNOSIS — Z1231 Encounter for screening mammogram for malignant neoplasm of breast: Secondary | ICD-10-CM

## 2019-08-21 ENCOUNTER — Encounter: Payer: Self-pay | Admitting: Nurse Practitioner

## 2019-08-23 ENCOUNTER — Other Ambulatory Visit: Payer: Self-pay | Admitting: Nurse Practitioner

## 2019-08-23 ENCOUNTER — Ambulatory Visit: Payer: Self-pay

## 2019-08-23 DIAGNOSIS — M2550 Pain in unspecified joint: Secondary | ICD-10-CM

## 2019-08-24 MED FILL — MELOXICAM 7.5 MG TABLET: 7.5 | 30 days supply | Qty: 30 | Fill #0

## 2019-08-29 MED FILL — AMLODIPINE BESYLATE 10 MG T: 10 | 30 days supply | Qty: 30 | Fill #0

## 2019-08-29 MED FILL — !ADVAIR 250/50 DISKUS: 250-50 | 30 days supply | Qty: 60 | Fill #0

## 2019-08-29 MED FILL — HYDROCHLOROTHIAZIDE 25 MG T: 25 | 30 days supply | Qty: 30 | Fill #0

## 2019-09-27 ENCOUNTER — Ambulatory Visit (HOSPITAL_COMMUNITY): Payer: Self-pay

## 2019-09-28 MED FILL — AMLODIPINE BESYLATE 10 MG T: 10 | 30 days supply | Qty: 30 | Fill #1

## 2019-09-28 MED FILL — ALBUTEROL SULFATE HFA 108 (: 108 (90 BAS | 25 days supply | Qty: 18 | Fill #1

## 2019-09-28 MED FILL — HYDROCHLOROTHIAZIDE 25 MG T: 25 | 30 days supply | Qty: 30 | Fill #1

## 2019-09-28 MED FILL — VIT D2 1.25 MG (50,000 UNIT: 1.25 MG | 84 days supply | Qty: 12 | Fill #1

## 2019-10-28 MED FILL — HYDROCHLOROTHIAZIDE 25 MG T: 25 | 30 days supply | Qty: 30 | Fill #2

## 2019-10-28 MED FILL — AMLODIPINE BESYLATE 10 MG T: 10 | 30 days supply | Qty: 30 | Fill #2

## 2019-11-29 MED FILL — HYDROCHLOROTHIAZIDE 25 MG T: 25 | 30 days supply | Qty: 30 | Fill #3

## 2019-11-29 MED FILL — AMLODIPINE BESYLATE 10 MG T: 10 | 30 days supply | Qty: 30 | Fill #3

## 2019-12-29 ENCOUNTER — Other Ambulatory Visit: Payer: Self-pay | Admitting: Nurse Practitioner

## 2019-12-29 DIAGNOSIS — M2550 Pain in unspecified joint: Secondary | ICD-10-CM

## 2019-12-29 DIAGNOSIS — E559 Vitamin D deficiency, unspecified: Secondary | ICD-10-CM

## 2019-12-29 MED FILL — AMLODIPINE BESYLATE 10 MG T: 10 | 30 days supply | Qty: 30 | Fill #4

## 2019-12-29 MED FILL — HYDROCHLOROTHIAZIDE 25 MG T: 25 | 30 days supply | Qty: 30 | Fill #4

## 2020-01-30 ENCOUNTER — Other Ambulatory Visit: Payer: Self-pay | Admitting: Nurse Practitioner

## 2020-01-30 DIAGNOSIS — M2550 Pain in unspecified joint: Secondary | ICD-10-CM

## 2020-01-30 DIAGNOSIS — E559 Vitamin D deficiency, unspecified: Secondary | ICD-10-CM

## 2020-02-10 ENCOUNTER — Other Ambulatory Visit: Payer: Self-pay

## 2020-02-10 ENCOUNTER — Encounter (HOSPITAL_COMMUNITY): Payer: Self-pay

## 2020-02-10 DIAGNOSIS — Y9289 Other specified places as the place of occurrence of the external cause: Secondary | ICD-10-CM | POA: Diagnosis not present

## 2020-02-10 DIAGNOSIS — S30861A Insect bite (nonvenomous) of abdominal wall, initial encounter: Secondary | ICD-10-CM | POA: Diagnosis not present

## 2020-02-10 DIAGNOSIS — W57XXXA Bitten or stung by nonvenomous insect and other nonvenomous arthropods, initial encounter: Secondary | ICD-10-CM | POA: Insufficient documentation

## 2020-02-10 DIAGNOSIS — Y9389 Activity, other specified: Secondary | ICD-10-CM | POA: Diagnosis not present

## 2020-02-10 DIAGNOSIS — F17210 Nicotine dependence, cigarettes, uncomplicated: Secondary | ICD-10-CM | POA: Insufficient documentation

## 2020-02-10 DIAGNOSIS — Y990 Civilian activity done for income or pay: Secondary | ICD-10-CM | POA: Diagnosis not present

## 2020-02-10 DIAGNOSIS — I10 Essential (primary) hypertension: Secondary | ICD-10-CM | POA: Insufficient documentation

## 2020-02-10 NOTE — ED Triage Notes (Signed)
Pt reports that she was bit by an insect this morning at work. Bite on R lower abdomen. Black dot in the middle surrounded by erythema. States that the redness has grown throughout the day. Denies pain, but states that it is itchy.

## 2020-02-11 ENCOUNTER — Emergency Department (HOSPITAL_COMMUNITY)
Admission: EM | Admit: 2020-02-11 | Discharge: 2020-02-11 | Disposition: A | Discharge status: Home / Self Care | Payer: Worker's Compensation | Attending: Emergency Medicine | Admitting: Emergency Medicine

## 2020-02-11 DIAGNOSIS — S30861A Insect bite (nonvenomous) of abdominal wall, initial encounter: Secondary | ICD-10-CM

## 2020-02-11 MED ORDER — DOXYCYCLINE HYCLATE 100 MG PO CAPS: 100.0000 mg | ORAL_CAPSULE | Freq: Two times a day (BID) | ORAL | 0 refills | Status: AC

## 2020-02-11 NOTE — ED Provider Notes (Signed)
[facility] Emergency Department Provider Note MRN:  [id number]  Arrival date & time: 02/11/20     Chief Complaint   Insect Bite   History of Present Illness   Rachael Calhoun is a 54 y.o. year-old female with a history of hypertension presenting to the ED with chief complaint of insect bite.  Patient was at work, working outside when she felt a stinging sensation to her abdomen, thinks she was bitten by a bug.  Has noted some redness around the bite site that has been spreading throughout the day.  Denies fever, no other complaints.  Discomfort is mild, constant.  Review of Systems  A complete 10 system review of systems was obtained and all systems are negative except as noted in the HPI and PMH.   Patient's Health History    Past Medical History:  Diagnosis Date  . Anxiety   . Arthritis   . Breast abscess    rt breast  . Hyperlipidemia   . Hypertension   . Hypertension     Past Surgical History:  Procedure Laterality Date  . Cyst removed from L breast    . INCISION AND DRAINAGE ABSCESS Right 10/08/2015   Procedure: INCISION AND DRAINAGE RIGHT BREAST ABSCESS;  Surgeon: Arta Bruce Kinsinger, MD;  Location: [facility];  Service: General;  Laterality: Right;  . Surgery on L wrist as a child    . TUBAL LIGATION      Family History  Problem Relation Age of Onset  . Diabetes Mother   . Cancer Mother   . Heart failure Father   . Hypertension Father     Social History   Socioeconomic History  . Marital status: Legally Separated    Spouse name: Not on file  . Number of children: Not on file  . Years of education: Not on file  . Highest education level: Not on file  Occupational History  . Not on file  Tobacco Use  . Smoking status: Current Every Day Smoker    Packs/day: 0.50    Types: Cigarettes  . Smokeless tobacco: Never Used  Vaping Use  . Vaping Use: Never used  Substance and Sexual Activity  . Alcohol use: No  . Drug use: No  . Sexual  activity: Yes  Other Topics Concern  . Not on file  Social History Narrative  . Not on file   Social Determinants of Health   Financial Resource Strain:   . Difficulty of Paying Living Expenses:   Food Insecurity:   . Worried About Charity fundraiser in the Last Year:   . Arboriculturist in the Last Year:   Transportation Needs:   . Film/video editor (Medical):   Marland Kitchen Lack of Transportation (Non-Medical):   Physical Activity:   . Days of Exercise per Week:   . Minutes of Exercise per Session:   Stress:   . Feeling of Stress :   Social Connections:   . Frequency of Communication with Friends and Family:   . Frequency of Social Gatherings with Friends and Family:   . Attends Religious Services:   . Active Member of Clubs or Organizations:   . Attends Archivist Meetings:   Marland Kitchen Marital Status:   Intimate Partner Violence:   . Fear of Current or Ex-Partner:   . Emotionally Abused:   Marland Kitchen Physically Abused:   . Sexually Abused:      Physical Exam   Vitals:   02/10/20 2250  BP: (!) 164/103  Pulse: 88  Resp: 20  Temp: 98.7 F (37.1 C)  SpO2: 99%    CONSTITUTIONAL: Well-appearing, NAD NEURO:  Alert and oriented x 3, no focal deficits EYES:  eyes equal and reactive ENT/NECK:  no LAD, no JVD CARDIO: Regular rate, well-perfused, normal S1 and S2 PULM:  CTAB no wheezing or rhonchi GI/GU:  normal bowel sounds, non-distended, non-tender MSK/SPINE:  No gross deformities, no edema SKIN: Circular area of erythema to the abdominal skin, 7 cm in diameter PSYCH:  Appropriate speech and behavior  *Additional and/or pertinent findings included in MDM below  Diagnostic and Interventional Summary    EKG Interpretation  Date/Time:    Ventricular Rate:    PR Interval:    QRS Duration:   QT Interval:    QTC Calculation:   R Axis:     Text Interpretation:        Labs Reviewed - No data to display  No orders to display    Medications - No data to display    Procedures  /  Critical Care Procedures  ED Course and Medical Decision Making  I have reviewed the triage vital signs, the nursing notes, and pertinent available records from the EMR.  Listed above are laboratory and imaging tests that I personally ordered, reviewed, and interpreted and then considered in my medical decision making (see below for details).      We will cover with antibiotics for possible cellulitis, will choose doxycycline to also cover for possible tickborne illness.  No signs of systemic illness, appropriate for discharge.    Elmer Sow. Pilar Plate, MD [facility] Emergency Medicine [facility] [email or web address]  Final Clinical Impressions(s) / ED Diagnoses     ICD-10-CM   1. Insect bite of abdominal wall, initial encounter  Z61.096E    [email or web address]     ED Discharge Orders         Ordered    doxycycline (VIBRAMYCIN) 100 MG capsule  2 times daily     Discontinue  Reprint     02/11/20 0222           Discharge Instructions Discussed with and Provided to Patient:     Discharge Instructions     You were evaluated in the Emergency Department and after careful evaluation, we did not find any emergent condition requiring admission or further testing in the hospital.  Your exam/testing today is overall reassuring.  Please take the antibiotics as directed.  Please return to the Emergency Department if you experience any worsening of your condition.  We encourage you to follow up with a primary care provider.  Thank you for allowing [place] to be a part of your care.      Sabas Sous, MD 02/11/20 [phone]

## 2020-02-11 NOTE — Discharge Instructions (Signed)
You were evaluated in the Emergency Department and after careful evaluation, we did not find any emergent condition requiring admission or further testing in the hospital.  Your exam/testing today is overall reassuring.  Please take the antibiotics as directed.  Please return to the Emergency Department if you experience any worsening of your condition.  We encourage you to follow up with a primary care provider.  Thank you for allowing [place] to be a part of your care.

## 2020-02-11 NOTE — ED Notes (Signed)
Area on patient's skin marked with skin marker. Area noted to be red with ecchymosis

## 2020-03-06 MED FILL — AMLODIPINE BESYLATE 10 MG T: 10 | 30 days supply | Qty: 30 | Fill #1

## 2020-03-06 MED FILL — HYDROCHLOROTHIAZIDE 25 MG T: 25 | 30 days supply | Qty: 30 | Fill #6

## 2020-03-06 MED FILL — ADVAIR 250/50 DISKUS: 250-50 | 30 days supply | Qty: 60 | Fill #1

## 2020-03-06 MED FILL — ALBUTEROL SULFATE HFA 108 (: 108 (90 BAS | 25 days supply | Qty: 18 | Fill #2

## 2020-04-05 ENCOUNTER — Other Ambulatory Visit: Payer: Self-pay | Admitting: Nurse Practitioner

## 2020-04-05 DIAGNOSIS — E559 Vitamin D deficiency, unspecified: Secondary | ICD-10-CM

## 2020-04-05 MED FILL — AMLODIPINE BESYLATE 10 MG T: 10 | 30 days supply | Qty: 30 | Fill #2

## 2020-04-05 MED FILL — HYDROCHLOROTHIAZIDE 25 MG T: 25 | 30 days supply | Qty: 30 | Fill #7

## 2020-04-05 NOTE — Telephone Encounter (Signed)
Request for RF- patient PCE- sent for review of request

## 2020-05-07 MED FILL — HYDROCHLOROTHIAZIDE 25 MG T: 25 | 30 days supply | Qty: 30 | Fill #8

## 2020-05-07 MED FILL — AMLODIPINE BESYLATE 10 MG T: 10 | 30 days supply | Qty: 30 | Fill #3

## 2020-06-06 MED FILL — HYDROCHLOROTHIAZIDE 25 MG T: 25 | 30 days supply | Qty: 30 | Fill #9

## 2020-06-06 MED FILL — AMLODIPINE BESYLATE 10 MG T: 10 | 30 days supply | Qty: 30 | Fill #4

## 2020-07-05 ENCOUNTER — Other Ambulatory Visit: Payer: Self-pay | Admitting: Nurse Practitioner

## 2020-07-05 DIAGNOSIS — I10 Essential (primary) hypertension: Secondary | ICD-10-CM

## 2020-07-05 MED FILL — HYDROCHLOROTHIAZIDE 25 MG T: 25 | 30 days supply | Qty: 30 | Fill #10

## 2020-07-05 NOTE — Telephone Encounter (Signed)
°  Notes to clinic: Patient transfer to office to set appointment    Requested Prescriptions  Pending Prescriptions Disp Refills   amLODipine (NORVASC) 10 MG tablet [Pharmacy Med Name: AMLODIPINE BESYLATE 10 MG T 10 Tablet] 30 tablet 1    Sig: Take 1 tablet (10 mg total) by mouth daily.      There is no refill protocol information for this order

## 2020-08-06 MED FILL — HYDROCHLOROTHIAZIDE 25 MG T: 25 | 30 days supply | Qty: 30 | Fill #11

## 2020-09-26 ENCOUNTER — Other Ambulatory Visit: Payer: Self-pay | Admitting: Nurse Practitioner

## 2020-09-26 DIAGNOSIS — J449 Chronic obstructive pulmonary disease, unspecified: Secondary | ICD-10-CM

## 2020-09-26 DIAGNOSIS — F17200 Nicotine dependence, unspecified, uncomplicated: Secondary | ICD-10-CM

## 2022-08-25 DIAGNOSIS — Z419 Encounter for procedure for purposes other than remedying health state, unspecified: Secondary | ICD-10-CM | POA: Diagnosis not present

## 2022-09-25 DIAGNOSIS — Z419 Encounter for procedure for purposes other than remedying health state, unspecified: Secondary | ICD-10-CM | POA: Diagnosis not present

## 2022-10-22 ENCOUNTER — Encounter (HOSPITAL_COMMUNITY): Payer: Self-pay

## 2022-10-22 ENCOUNTER — Ambulatory Visit (HOSPITAL_COMMUNITY)
Admission: EM | Admit: 2022-10-22 | Discharge: 2022-10-22 | Disposition: A | Discharge status: Home / Self Care | Payer: Medicaid Other | Attending: Family Medicine | Admitting: Family Medicine

## 2022-10-22 DIAGNOSIS — B372 Candidiasis of skin and nail: Secondary | ICD-10-CM | POA: Diagnosis not present

## 2022-10-22 DIAGNOSIS — F17200 Nicotine dependence, unspecified, uncomplicated: Secondary | ICD-10-CM

## 2022-10-22 DIAGNOSIS — I10 Essential (primary) hypertension: Secondary | ICD-10-CM

## 2022-10-22 DIAGNOSIS — J4489 Other specified chronic obstructive pulmonary disease: Secondary | ICD-10-CM

## 2022-10-22 MED ORDER — AMLODIPINE BESYLATE 10 MG PO TABS: 10.0000 mg | ORAL_TABLET | Freq: Every day | ORAL | 1 refills | Status: DC

## 2022-10-22 MED ORDER — FLUCONAZOLE 100 MG PO TABS: ORAL_TABLET | ORAL | 0 refills | Status: AC

## 2022-10-22 MED ORDER — HYDROCHLOROTHIAZIDE 25 MG PO TABS: 25.0000 mg | ORAL_TABLET | Freq: Every day | ORAL | 1 refills | Status: DC

## 2022-10-22 MED ORDER — ALBUTEROL SULFATE HFA 108 (90 BASE) MCG/ACT IN AERS: 2.0000 | INHALATION_SPRAY | RESPIRATORY_TRACT | 0 refills | Status: DC | PRN

## 2022-10-22 NOTE — ED Triage Notes (Addendum)
Chief Complaint: Patient needing refill on her inhaler and blood pressure medication. Her PCP would not refill until she can get seen in March to establish care.  Patient currently having headaches and wheezing.  Patient states she has a itchy rash under the breast and the groin area. States she has tried some OTC cream she can not remember the name of with no relief.

## 2022-10-22 NOTE — ED Provider Notes (Signed)
[place]    CSN: [id number] Arrival date & time: 10/22/22  1500      History   Chief Complaint Chief Complaint  Patient presents with   Medication Refill   Rash    HPI Rachael Calhoun is a 57 y.o. female.    Medication Refill Rash  Here for elevated blood pressure.  She is going to be seeing a new primary care in March and so she needs [place] to send in her blood pressure medications which have been out for a month or so.  She used to be on amlodipine 10 and hydrochlorothiazide 25 mg; when she was on those doses her blood pressure was good and she had no side effects  And saw a different primary care provider and was prescribed losartan 50 mg, and the above medications were discontinued.  Patient does not know how her blood pressure was on the losartan by itself.  She has had some headaches in the last week but no chest pain.  She does get a little short of breath with walking  She also has had a rash under both breasts that is very irritated and pruritic.  She has been putting Gold bond and clotrimazole on it  Past Medical History:  Diagnosis Date   Anxiety    Arthritis    Breast abscess    rt breast   Hyperlipidemia    Hypertension    Hypertension     Patient Active Problem List   Diagnosis Date Noted   Essential hypertension 07/08/2018   Hyperlipemia 07/08/2018   Tobacco use disorder 07/08/2018   Atypical chest pain 07/07/2018    Past Surgical History:  Procedure Laterality Date   Cyst removed from L breast     INCISION AND DRAINAGE ABSCESS Right 10/08/2015   Procedure: INCISION AND DRAINAGE RIGHT BREAST ABSCESS;  Surgeon: Arta Bruce Kinsinger, MD;  Location: [facility];  Service: General;  Laterality: Right;   Surgery on L wrist as a child     TUBAL LIGATION      OB History   No obstetric history on file.      Home Medications    Prior to Admission medications   Medication Sig Start Date End Date Taking? Authorizing Provider  fluconazole  (DIFLUCAN) 100 MG tablet 2 tabs together by mouth the first day, then 1 tab daily for 6 more days. 10/22/22  Yes Barrett Henle, MD  albuterol (VENTOLIN HFA) 108 (90 Base) MCG/ACT inhaler Inhale 2 puffs into the lungs every 4 (four) hours as needed for wheezing or shortness of breath. 10/22/22   Barrett Henle, MD  amLODipine (NORVASC) 10 MG tablet Take 1 tablet (10 mg total) by mouth daily. 10/22/22   Barrett Henle, MD  hydrochlorothiazide (HYDRODIURIL) 25 MG tablet Take 1 tablet (25 mg total) by mouth daily. 10/22/22   Barrett Henle, MD    Family History Family History  Problem Relation Age of Onset   Diabetes Mother    Cancer Mother    Heart failure Father    Hypertension Father     Social History Social History   Tobacco Use   Smoking status: Every Day    Packs/day: 0.50    Types: Cigarettes   Smokeless tobacco: Never  Vaping Use   Vaping Use: Never used  Substance Use Topics   Alcohol use: No   Drug use: No     Allergies   Losartan potassium   Review of Systems Review of Systems  Skin:  Positive for rash.     Physical Exam Triage Vital Signs ED Triage Vitals  Enc Vitals Group     BP 10/22/22 1714 (!) 186/128     Pulse Rate 10/22/22 1714 82     Resp 10/22/22 1714 16     Temp 10/22/22 1714 98.2 F (36.8 C)     Temp Source 10/22/22 1714 Oral     SpO2 10/22/22 1714 95 %     Weight --      Height 10/22/22 1714 '4\' 10"'$  (1.473 m)     Head Circumference --      Peak Flow --      Pain Score 10/22/22 1710 5     Pain Loc --      Pain Edu? --      Excl. in [place]? --    No data found.  Updated Vital Signs BP (!) 186/128 (BP Location: Right Arm)   Pulse 82   Temp 98.2 F (36.8 C) (Oral)   Resp 16   Ht '4\' 10"'$  (1.473 m)   LMP  (LMP Unknown)   SpO2 95%   BMI 49.03 kg/m   Visual Acuity Right Eye Distance:   Left Eye Distance:   Bilateral Distance:    Right Eye Near:   Left Eye Near:    Bilateral Near:     Physical Exam Vitals reviewed.   Constitutional:      General: She is not in acute distress.    Appearance: She is not ill-appearing, toxic-appearing or diaphoretic.  HENT:     Mouth/Throat:     Mouth: Mucous membranes are moist.     Pharynx: No oropharyngeal exudate or posterior oropharyngeal erythema.  Eyes:     Extraocular Movements: Extraocular movements intact.     Conjunctiva/sclera: Conjunctivae normal.     Pupils: Pupils are equal, round, and reactive to light.  Cardiovascular:     Rate and Rhythm: Normal rate and regular rhythm.     Heart sounds: No murmur heard. Pulmonary:     Effort: Pulmonary effort is normal. No respiratory distress.     Breath sounds: No stridor. No wheezing, rhonchi or rales.  Musculoskeletal:     Cervical back: Neck supple.  Lymphadenopathy:     Cervical: No cervical adenopathy.  Skin:    Capillary Refill: Capillary refill takes less than 2 seconds.     Coloration: Skin is not pale.     Comments: There is mild confluent erythema under the breast bilaterally, consistent with intertrigo and candidiasis.  Neurological:     General: No focal deficit present.     Mental Status: She is alert and oriented to person, place, and time.  Psychiatric:        Behavior: Behavior normal.      UC Treatments / Results  Labs (all labs ordered are listed, but only abnormal results are displayed) Labs Reviewed - No data to display  EKG   Radiology No results found.  Procedures Procedures (including critical care time)  Medications Ordered in UC Medications - No data to display  Initial Impression / Assessment and Plan / UC Course  I have reviewed the triage vital signs and the nursing notes.  Pertinent labs & imaging results that were available during my care of the patient were reviewed by me and considered in my medical decision making (see chart for details).        Patient is seen for hypertension that is worse at present with very high  blood pressure at triage, and I  have restarted the prior medication for blood pressure.  I doubt that losartan 50 mg alone would adequately treat the hypertension.  Also she is treated for candidiasis with fluconazole orally.   Final Clinical Impressions(s) / UC Diagnoses   Final diagnoses:  Essential hypertension, benign  Candidal intertrigo     Discharge Instructions      Albuterol inhaler--do 2 puffs every 4 hours as needed for shortness of breath or wheezing  Amlodipine 10 mg--1 daily for blood pressure  Hydrochlorothiazide 25 mg--1 daily for blood pressure  Fluconazole 100 mg--2 tabs together by mouth the first day, then 1 tab daily for 6 more days; this is for the yeast on your abdomen and chest.  It can help to put a piece of soft cotton cloth between the skin of your breast and the skin of your abdomen.     ED Prescriptions     Medication Sig Dispense Auth. Provider   albuterol (VENTOLIN HFA) 108 (90 Base) MCG/ACT inhaler Inhale 2 puffs into the lungs every 4 (four) hours as needed for wheezing or shortness of breath. 18 g Barrett Henle, MD   amLODipine (NORVASC) 10 MG tablet Take 1 tablet (10 mg total) by mouth daily. 30 tablet Barrett Henle, MD   hydrochlorothiazide (HYDRODIURIL) 25 MG tablet Take 1 tablet (25 mg total) by mouth daily. 30 tablet Barrett Henle, MD   fluconazole (DIFLUCAN) 100 MG tablet 2 tabs together by mouth the first day, then 1 tab daily for 6 more days. 8 tablet Rehmat Murtagh, Gwenlyn Perking, MD      PDMP not reviewed this encounter.   Barrett Henle, MD 10/22/22 [phone]

## 2022-10-22 NOTE — Discharge Instructions (Signed)
Albuterol inhaler--do 2 puffs every 4 hours as needed for shortness of breath or wheezing  Amlodipine 10 mg--1 daily for blood pressure  Hydrochlorothiazide 25 mg--1 daily for blood pressure  Fluconazole 100 mg--2 tabs together by mouth the first day, then 1 tab daily for 6 more days; this is for the yeast on your abdomen and chest.  It can help to put a piece of soft cotton cloth between the skin of your breast and the skin of your abdomen.

## 2022-10-24 DIAGNOSIS — Z419 Encounter for procedure for purposes other than remedying health state, unspecified: Secondary | ICD-10-CM | POA: Diagnosis not present

## 2022-11-14 ENCOUNTER — Other Ambulatory Visit: Payer: Self-pay

## 2022-11-14 ENCOUNTER — Ambulatory Visit (INDEPENDENT_AMBULATORY_CARE_PROVIDER_SITE_OTHER): Payer: Medicaid Other | Admitting: Primary Care

## 2022-11-14 ENCOUNTER — Encounter (INDEPENDENT_AMBULATORY_CARE_PROVIDER_SITE_OTHER): Payer: Self-pay | Admitting: Primary Care

## 2022-11-14 VITALS — BP 140/84 | HR 88 | Resp 16 | Ht <= 58 in | Wt 269.2 lb

## 2022-11-14 DIAGNOSIS — E782 Mixed hyperlipidemia: Secondary | ICD-10-CM

## 2022-11-14 DIAGNOSIS — B372 Candidiasis of skin and nail: Secondary | ICD-10-CM

## 2022-11-14 DIAGNOSIS — Z1211 Encounter for screening for malignant neoplasm of colon: Secondary | ICD-10-CM

## 2022-11-14 DIAGNOSIS — J4489 Other specified chronic obstructive pulmonary disease: Secondary | ICD-10-CM | POA: Diagnosis not present

## 2022-11-14 DIAGNOSIS — F17200 Nicotine dependence, unspecified, uncomplicated: Secondary | ICD-10-CM

## 2022-11-14 DIAGNOSIS — I10 Essential (primary) hypertension: Secondary | ICD-10-CM

## 2022-11-14 DIAGNOSIS — Z6841 Body Mass Index (BMI) 40.0 and over, adult: Secondary | ICD-10-CM

## 2022-11-14 DIAGNOSIS — Z1231 Encounter for screening mammogram for malignant neoplasm of breast: Secondary | ICD-10-CM

## 2022-11-14 DIAGNOSIS — Z833 Family history of diabetes mellitus: Secondary | ICD-10-CM | POA: Diagnosis not present

## 2022-11-14 DIAGNOSIS — F17210 Nicotine dependence, cigarettes, uncomplicated: Secondary | ICD-10-CM

## 2022-11-14 DIAGNOSIS — Z7689 Persons encountering health services in other specified circumstances: Secondary | ICD-10-CM | POA: Diagnosis not present

## 2022-11-14 DIAGNOSIS — Z1159 Encounter for screening for other viral diseases: Secondary | ICD-10-CM

## 2022-11-14 MED ORDER — NICOTINE 14 MG/24HR TD PT24
14.0000 mg | MEDICATED_PATCH | Freq: Every day | TRANSDERMAL | 0 refills | Status: AC
  Filled 2022-11-14: qty 28, 28d supply, fill #0

## 2022-11-14 MED ORDER — BUDESONIDE-FORMOTEROL FUMARATE 160-4.5 MCG/ACT IN AERO
2.0000 | INHALATION_SPRAY | Freq: Two times a day (BID) | RESPIRATORY_TRACT | 3 refills | Status: DC
  Filled 2022-11-14: qty 10.2, 30d supply, fill #0
  Filled 2022-12-15: qty 10.2, 30d supply, fill #1

## 2022-11-14 MED ORDER — NYSTATIN 100000 UNIT/GM EX POWD
1.0000 | Freq: Three times a day (TID) | CUTANEOUS | 0 refills | Status: DC
  Filled 2022-11-14: qty 15, 5d supply, fill #0

## 2022-11-14 MED ORDER — NICOTINE 21 MG/24HR TD PT24
21.0000 mg | MEDICATED_PATCH | Freq: Every day | TRANSDERMAL | 0 refills | Status: AC
  Filled 2022-11-14: qty 28, 28d supply, fill #0

## 2022-11-14 MED ORDER — ALBUTEROL SULFATE HFA 108 (90 BASE) MCG/ACT IN AERS
2.0000 | INHALATION_SPRAY | RESPIRATORY_TRACT | 0 refills | Status: DC | PRN
  Filled 2022-11-14: qty 18, 17d supply, fill #0

## 2022-11-14 MED ORDER — NICOTINE 7 MG/24HR TD PT24
7.0000 mg | MEDICATED_PATCH | Freq: Every day | TRANSDERMAL | 0 refills | Status: AC
  Filled 2022-11-14: qty 28, 28d supply, fill #0

## 2022-11-14 MED ORDER — TRIAMTERENE-HCTZ 75-50 MG PO TABS
1.0000 | ORAL_TABLET | Freq: Every day | ORAL | 3 refills | Status: DC
  Filled 2022-11-14: qty 30, 30d supply, fill #0
  Filled 2022-12-15: qty 30, 30d supply, fill #1

## 2022-11-14 MED ORDER — ALBUTEROL SULFATE HFA 108 (90 BASE) MCG/ACT IN AERS
2.0000 | INHALATION_SPRAY | Freq: Four times a day (QID) | RESPIRATORY_TRACT | 0 refills | Status: DC | PRN
  Filled 2022-11-14 – 2022-12-15 (×2): qty 18, 25d supply, fill #0

## 2022-11-14 NOTE — Progress Notes (Unsigned)
New Patient Office Visit  Subjective    Patient ID: Rachael Calhoun, female    DOB: [birth year]  Age: 57 y.o. MRN: [id number]  CC:  Chief Complaint  Patient presents with   New Patient (Initial Visit)    HPI Ms.Rachael Calhoun presents is a 57 year old female to establish care.  Outpatient Encounter Medications as of 11/14/2022  Medication Sig   albuterol (VENTOLIN HFA) 108 (90 Base) MCG/ACT inhaler Inhale 2 puffs into the lungs every 6 (six) hours as needed for wheezing or shortness of breath.   budesonide-formoterol (SYMBICORT) 160-4.5 MCG/ACT inhaler Inhale 2 puffs into the lungs 2 (two) times daily.   fluconazole (DIFLUCAN) 100 MG tablet 2 tabs together by mouth the first day, then 1 tab daily for 6 more days.   nicotine (NICODERM CQ) 14 mg/24hr patch Place 1 patch (14 mg total) onto the skin daily.   nicotine (NICODERM CQ) 21 mg/24hr patch Place 1 patch (21 mg total) onto the skin daily.   nicotine (NICODERM CQ) 7 mg/24hr patch Place 1 patch (7 mg total) onto the skin daily.   nystatin (MYCOSTATIN/NYSTOP) powder Apply 1 Application topically 3 (three) times daily.   triamterene-hydrochlorothiazide (MAXZIDE) 75-50 MG tablet Take 1 tablet by mouth daily.   [DISCONTINUED] albuterol (VENTOLIN HFA) 108 (90 Base) MCG/ACT inhaler Inhale 2 puffs into the lungs every 4 (four) hours as needed for wheezing or shortness of breath.   [DISCONTINUED] hydrochlorothiazide (HYDRODIURIL) 25 MG tablet Take 1 tablet (25 mg total) by mouth daily.   albuterol (VENTOLIN HFA) 108 (90 Base) MCG/ACT inhaler Inhale 2 puffs into the lungs every 4 (four) hours as needed for wheezing or shortness of breath.   amLODipine (NORVASC) 10 MG tablet Take 1 tablet (10 mg total) by mouth daily. (Patient not taking: Reported on 11/14/2022)   No facility-administered encounter medications on file as of 11/14/2022.    Past Medical History:  Diagnosis Date   Anxiety    Arthritis    Breast abscess    rt breast    Hyperlipidemia    Hypertension    Hypertension     Past Surgical History:  Procedure Laterality Date   Cyst removed from L breast     INCISION AND DRAINAGE ABSCESS Right 10/08/2015   Procedure: INCISION AND DRAINAGE RIGHT BREAST ABSCESS;  Surgeon: Arta Bruce Kinsinger, MD;  Location: [facility];  Service: General;  Laterality: Right;   Surgery on L wrist as a child     TUBAL LIGATION      Family History  Problem Relation Age of Onset   Diabetes Mother    Cancer Mother    Heart failure Father    Hypertension Father     Social History   Socioeconomic History   Marital status: Legally Separated    Spouse name: Not on file   Number of children: Not on file   Years of education: Not on file   Highest education level: Not on file  Occupational History   Not on file  Tobacco Use   Smoking status: Every Day    Packs/day: .5    Types: Cigarettes   Smokeless tobacco: Never  Vaping Use   Vaping Use: Never used  Substance and Sexual Activity   Alcohol use: No   Drug use: No   Sexual activity: Yes  Other Topics Concern   Not on file  Social History Narrative   Not on file   Social Determinants of Health   Financial Resource Strain: Not  on file  Food Insecurity: Not on file  Transportation Needs: Not on file  Physical Activity: Not on file  Stress: Not on file  Social Connections: Not on file  Intimate Partner Violence: Not on file    ROS Comprehensive ROS Pertinent positive and negative noted in HPI     Objective    Blood Pressure (Abnormal) 140/84 (BP Location: Right Arm, Patient Position: Sitting, Cuff Size: Large)   Pulse 88   Respiration 16   Height 4\' 10"  (1.473 m)   Weight 269 lb 3.2 oz (122.1 kg)   Last Menstrual Period  (LMP Unknown)   Oxygen Saturation 95%   Body Mass Index 56.26 kg/m   Physical exam: General: Vital signs reviewed.  Patient is well-developed and well-nourished, severe morbid obesity in no acute distress and cooperative with  exam. Head: Normocephalic and atraumatic. Eyes: EOMI, conjunctivae normal, no scleral icterus. Neck: Supple, trachea midline, normal ROM, no JVD, masses, thyromegaly, or carotid bruit present. Cardiovascular: RRR, S1 normal, S2 normal, no murmurs, gallops, or rubs. Pulmonary/Chest: Clear to auscultation bilaterally, no wheezes, rales, or rhonchi. Abdominal: Soft, non-tender, non-distended, BS +, no masses, organomegaly, or guarding present. Musculoskeletal: No joint deformities, erythema, or stiffness, ROM full and nontender. Extremities: No lower extremity edema bilaterally,  pulses symmetric and intact bilaterally. No cyanosis or clubbing. Neurological: A&O x3, Strength is normal Skin: yeast under breast, stomach (fold) and thighs  Psychiatric: Normal mood and affect. speech and behavior is normal. Cognition and memory are normal.       Assessment & Plan:  [name] was seen today for new patient (initial visit).  Diagnoses and all orders for this visit:  Encounter to establish care  Essential hypertension BP goal - < 130/80 Explained that having normal blood pressure is the goal and medications are helping to get to goal and maintain normal blood pressure. DIET: Limit salt intake, read nutrition labels to check salt content, limit fried and high fatty foods  Avoid using multisymptom OTC cold preparations that generally contain sudafed which can rise BP. Consult with pharmacist on best cold relief products to use for persons with HTN EXERCISE Discussed incorporating exercise such as walking - 30 minutes most days of the week and can do in 10 minute intervals    -     CMP14+EGFR -     triamterene-hydrochlorothiazide (MAXZIDE) 75-50 MG tablet; Take 1 tablet by mouth daily.  Mixed hyperlipidemia -     Lipid panel  Morbid obesity ([place]) Discussed diet and exercise for person with BMI >25. Instructed: You must burn more calories than you eat. Losing 5 percent of your body weight should be  considered a success. In the longer term, losing more than 15 percent of your body weight and staying at this weight is an extremely good result. However, keep in mind that even losing 5 percent of your body weight leads to important health benefits, so try not to get discouraged if you're not able to lose more than this. Will recheck weight in 3-6 months.   Colon cancer screening -     Ambulatory referral to Gastroenterology  Encounter for screening mammogram for malignant neoplasm of breast -     MM DIGITAL SCREENING BILATERAL; Future  Encounter for HCV screening test for low risk patient -     HCV Ab w Reflex to Quant PCR  Tobacco use disorder - I have recommended complete cessation of tobacco use. I have discussed various options available for assistance with tobacco cessation  including over the counter methods (Nicotine gum, patch and lozenges). We also discussed prescription options (Chantix, Nicotine Inhaler / Nasal Spray). The patient is interested in pursuing any prescription tobacco cessation options at this time.  -     nicotine (NICODERM CQ) 21 mg/24hr patch; Place 1 patch (21 mg total) onto the skin daily. -     nicotine (NICODERM CQ) 14 mg/24hr patch; Place 1 patch (14 mg total) onto the skin daily. -     nicotine (NICODERM CQ) 7 mg/24hr patch; Place 1 patch (7 mg total) onto the skin daily. -      Family history of diabetes mellitus -     CBC with Differential/Platelet -     Hemoglobin A1c  COPD with chronic bronchitis -     albuterol (VENTOLIN HFA) 108 (90 Base) MCG/ACT inhaler; Inhale 2 puffs into the lungs every 4 (four) hours as needed for wheezing or shortness of breath.  Yeast dermatitis -     nystatin (MYCOSTATIN/NYSTOP) powder; Apply 1 Application topically 3 (three) times daily.  Other orders -     budesonide-formoterol (SYMBICORT) 160-4.5 MCG/ACT inhaler; Inhale 2 puffs into the lungs 2 (two) times daily. -     albuterol (VENTOLIN HFA) 108 (90 Base) MCG/ACT  inhaler; Inhale 2 puffs into the lungs every 6 (six) hours as needed for wheezing or shortness of breath. -     Interpretation:      Return in about 6 weeks (around 12/26/2022) for pap.   Kerin Perna, NP

## 2022-11-15 ENCOUNTER — Other Ambulatory Visit (INDEPENDENT_AMBULATORY_CARE_PROVIDER_SITE_OTHER): Payer: Self-pay | Admitting: Primary Care

## 2022-11-15 DIAGNOSIS — E782 Mixed hyperlipidemia: Secondary | ICD-10-CM

## 2022-11-15 LAB — CMP14+EGFR
ALT: 17 IU/L (ref 0–32)
AST: 14 IU/L (ref 0–40)
Albumin/Globulin Ratio: 1.8 (ref 1.2–2.2)
Albumin: 4.4 g/dL (ref 3.8–4.9)
Alkaline Phosphatase: 134 IU/L — ABNORMAL HIGH (ref 44–121)
BUN/Creatinine Ratio: 20 (ref 9–23)
BUN: 15 mg/dL (ref 6–24)
Bilirubin Total: 0.3 mg/dL (ref 0.0–1.2)
CO2: 31 mmol/L — ABNORMAL HIGH (ref 20–29)
Calcium: 9.6 mg/dL (ref 8.7–10.2)
Chloride: 96 mmol/L (ref 96–106)
Creatinine, Ser: 0.76 mg/dL (ref 0.57–1.00)
Globulin, Total: 2.4 g/dL (ref 1.5–4.5)
Glucose: 99 mg/dL (ref 70–99)
Potassium: 3.7 mmol/L (ref 3.5–5.2)
Sodium: 143 mmol/L (ref 134–144)
Total Protein: 6.8 g/dL (ref 6.0–8.5)
eGFR: 92 mL/min/{1.73_m2} (ref >59)

## 2022-11-15 LAB — CBC WITH DIFFERENTIAL/PLATELET
Basophils Absolute: 0.1 10*3/uL (ref 0.0–0.2)
Basos: 1 %
EOS (ABSOLUTE): 0.2 10*3/uL (ref 0.0–0.4)
Eos: 2 %
Hematocrit: 51.2 % — ABNORMAL HIGH (ref 34.0–46.6)
Hemoglobin: 17 g/dL — ABNORMAL HIGH (ref 11.1–15.9)
Immature Grans (Abs): 0 10*3/uL (ref 0.0–0.1)
Immature Granulocytes: 0 %
Lymphocytes Absolute: 2.3 10*3/uL (ref 0.7–3.1)
Lymphs: 21 %
MCH: 30.6 pg (ref 26.6–33.0)
MCHC: 33.2 g/dL (ref 31.5–35.7)
MCV: 92 fL (ref 79–97)
Monocytes Absolute: 0.6 10*3/uL (ref 0.1–0.9)
Monocytes: 6 %
Neutrophils Absolute: 7.6 10*3/uL — ABNORMAL HIGH (ref 1.4–7.0)
Neutrophils: 70 %
Platelets: 342 10*3/uL (ref 150–450)
RBC: 5.55 x10E6/uL — ABNORMAL HIGH (ref 3.77–5.28)
RDW: 13.8 % (ref 11.7–15.4)
WBC: 10.8 10*3/uL (ref 3.4–10.8)

## 2022-11-15 LAB — HEMOGLOBIN A1C
Est. average glucose Bld gHb Est-mCnc: 126 mg/dL
Hgb A1c MFr Bld: 6 % — ABNORMAL HIGH (ref 4.8–5.6)

## 2022-11-15 LAB — HCV INTERPRETATION

## 2022-11-15 LAB — LIPID PANEL
Chol/HDL Ratio: 6 ratio — ABNORMAL HIGH (ref 0.0–4.4)
Cholesterol, Total: 308 mg/dL — ABNORMAL HIGH (ref 100–199)
HDL: 51 mg/dL (ref >39)
LDL Chol Calc (NIH): 224 mg/dL — ABNORMAL HIGH (ref 0–99)
Triglycerides: 174 mg/dL — ABNORMAL HIGH (ref 0–149)
VLDL Cholesterol Cal: 33 mg/dL (ref 5–40)

## 2022-11-15 LAB — HCV AB W REFLEX TO QUANT PCR: HCV Ab: NONREACTIVE

## 2022-11-15 MED ORDER — ATORVASTATIN CALCIUM 80 MG PO TABS
80.0000 mg | ORAL_TABLET | Freq: Every day | ORAL | 3 refills | Status: DC
  Filled 2022-11-15: qty 30, 30d supply, fill #0

## 2022-11-17 ENCOUNTER — Telehealth (INDEPENDENT_AMBULATORY_CARE_PROVIDER_SITE_OTHER): Payer: Self-pay

## 2022-11-17 ENCOUNTER — Other Ambulatory Visit: Payer: Self-pay

## 2022-11-17 ENCOUNTER — Telehealth (INDEPENDENT_AMBULATORY_CARE_PROVIDER_SITE_OTHER): Payer: Self-pay | Admitting: Primary Care

## 2022-11-17 NOTE — Telephone Encounter (Signed)
Pt is calling in requesting her atorvastatin (LIPITOR) 80 MG tablet [id number] be sent to the [facility], [place]

## 2022-11-17 NOTE — Telephone Encounter (Signed)
Contacted pt to go over lab results pt is aware and doesn't have any questions or concerns 

## 2022-11-18 ENCOUNTER — Other Ambulatory Visit: Payer: Self-pay

## 2022-11-18 ENCOUNTER — Other Ambulatory Visit (INDEPENDENT_AMBULATORY_CARE_PROVIDER_SITE_OTHER): Payer: Self-pay

## 2022-11-18 DIAGNOSIS — E782 Mixed hyperlipidemia: Secondary | ICD-10-CM

## 2022-11-18 MED ORDER — ATORVASTATIN CALCIUM 80 MG PO TABS: 80.0000 mg | ORAL_TABLET | Freq: Every day | ORAL | 3 refills | Status: AC

## 2022-11-18 NOTE — Telephone Encounter (Signed)
Medication has been sent.  

## 2022-11-19 ENCOUNTER — Other Ambulatory Visit: Payer: Self-pay

## 2022-11-24 DIAGNOSIS — Z419 Encounter for procedure for purposes other than remedying health state, unspecified: Secondary | ICD-10-CM | POA: Diagnosis not present

## 2022-12-15 ENCOUNTER — Other Ambulatory Visit: Payer: Self-pay

## 2022-12-24 DIAGNOSIS — Z419 Encounter for procedure for purposes other than remedying health state, unspecified: Secondary | ICD-10-CM | POA: Diagnosis not present

## 2023-01-01 ENCOUNTER — Ambulatory Visit
Admission: RE | Admit: 2023-01-01 | Discharge: 2023-01-01 | Disposition: A | Discharge status: Home / Self Care | Payer: Medicaid Other | Source: Ambulatory Visit | Attending: Primary Care | Admitting: Primary Care

## 2023-01-01 DIAGNOSIS — Z1231 Encounter for screening mammogram for malignant neoplasm of breast: Secondary | ICD-10-CM

## 2023-01-08 ENCOUNTER — Ambulatory Visit (INDEPENDENT_AMBULATORY_CARE_PROVIDER_SITE_OTHER): Payer: Medicaid Other | Admitting: Primary Care

## 2023-01-08 ENCOUNTER — Encounter (INDEPENDENT_AMBULATORY_CARE_PROVIDER_SITE_OTHER): Payer: Self-pay | Admitting: Primary Care

## 2023-01-08 ENCOUNTER — Other Ambulatory Visit: Payer: Self-pay

## 2023-01-08 VITALS — BP 159/109 | HR 99 | Resp 16 | Wt 265.0 lb

## 2023-01-08 DIAGNOSIS — I10 Essential (primary) hypertension: Secondary | ICD-10-CM

## 2023-01-08 DIAGNOSIS — J4489 Other specified chronic obstructive pulmonary disease: Secondary | ICD-10-CM

## 2023-01-08 MED ORDER — TRIAMTERENE-HCTZ 75-50 MG PO TABS
1.0000 | ORAL_TABLET | Freq: Every day | ORAL | 3 refills | Status: AC
Start: 2023-01-08 — End: ?
  Filled 2023-01-08: qty 30, 30d supply, fill #0
  Filled 2023-01-29: qty 90, 90d supply, fill #0

## 2023-01-08 MED ORDER — BUDESONIDE-FORMOTEROL FUMARATE 160-4.5 MCG/ACT IN AERO
2.0000 | INHALATION_SPRAY | Freq: Two times a day (BID) | RESPIRATORY_TRACT | 3 refills | Status: AC
  Filled 2023-01-08 – 2023-01-29 (×2): qty 10.2, 30d supply, fill #0
  Filled 2023-03-26: qty 10.2, 30d supply, fill #1
  Filled 2023-04-30: qty 10.2, 30d supply, fill #2
  Filled 2023-06-05 – 2023-12-07 (×2): qty 10.2, 30d supply, fill #3

## 2023-01-08 MED ORDER — AMLODIPINE BESYLATE 10 MG PO TABS
10.0000 mg | ORAL_TABLET | Freq: Every day | ORAL | 1 refills | Status: AC
  Filled 2023-01-08 – 2023-11-03 (×2): qty 30, 30d supply, fill #0

## 2023-01-08 NOTE — Progress Notes (Signed)
[facility]  Rachael Calhoun, is a 57 y.o. female  [id number]  [id number]  DOB - 29-Apr-1966  Chief Complaint  Patient presents with   Gynecologic Exam   Hypertension    Pt states she take 1/2 pill in the morning and at night because her job doesn't let her go to the bathroom and bp medication makes her urinate    Hyperlipidemia    Pt stopped taking medication. Pt states the medication makes her sweat and nausea        Subjective:   Rachael Calhoun is a 57 y.o. female here today for a follow up visit hypertension. Patient has No headache, No chest pain, No abdominal pain - No Nausea, No new weakness tingling or numbness, No Cough - shortness of breath.  Blood pressure is extremely elevated and she does not take her medication as prescribed.  She takes half a pill to avoid having to use the bathroom frequently at work.  This is compromising her health increasing risk for stroke or heart attack.  No problems updated.  Allergies  Allergen Reactions   Losartan Potassium Other (See Comments)    "Felt like throat was closing up"    Past Medical History:  Diagnosis Date   Anxiety    Arthritis    Breast abscess    rt breast   Hyperlipidemia    Hypertension    Hypertension     Current Outpatient Medications on File Prior to Visit  Medication Sig Dispense Refill   albuterol (VENTOLIN HFA) 108 (90 Base) MCG/ACT inhaler Inhale 2 puffs into the lungs every 6 (six) hours as needed for wheezing or shortness of breath. 18 g 0   albuterol (VENTOLIN HFA) 108 (90 Base) MCG/ACT inhaler Inhale 2 puffs into the lungs every 4 (four) hours as needed for wheezing or shortness of breath. 18 g 0   amLODipine (NORVASC) 10 MG tablet Take 1 tablet (10 mg total) by mouth daily. (Patient not taking: Reported on 11/14/2022) 30 tablet 1   atorvastatin (LIPITOR) 80 MG tablet Take 1 tablet (80 mg total) by mouth daily. (Patient not taking: Reported on 01/08/2023) 90 tablet 3    budesonide-formoterol (SYMBICORT) 160-4.5 MCG/ACT inhaler Inhale 2 puffs into the lungs 2 (two) times daily. 10.2 g 3   fluconazole (DIFLUCAN) 100 MG tablet 2 tabs together by mouth the first day, then 1 tab daily for 6 more days. (Patient not taking: Reported on 01/08/2023) 8 tablet 0   nicotine (NICODERM CQ) 14 mg/24hr patch Place 1 patch (14 mg total) onto the skin daily. 28 patch 0   nicotine (NICODERM CQ) 21 mg/24hr patch Place 1 patch (21 mg total) onto the skin daily. 28 patch 0   nicotine (NICODERM CQ) 7 mg/24hr patch Place 1 patch (7 mg total) onto the skin daily. 28 patch 0   nystatin (MYCOSTATIN/NYSTOP) powder Apply 1 Application topically 3 (three) times daily. 15 g 0   triamterene-hydrochlorothiazide (MAXZIDE) 75-50 MG tablet Take 1 tablet by mouth daily. 90 tablet 3   No current facility-administered medications on file prior to visit.    Objective:   Vitals:   01/08/23 0927 01/08/23 0928  BP: (Abnormal) 146/94 (Abnormal) 159/109  Pulse: 99   Resp: 16   SpO2: 95%   Weight: 265 lb (120.2 kg)     Comprehensive ROS Pertinent positive and negative noted in HPI   Exam General appearance : Awake, alert, not in any distress. Speech Clear. Not toxic looking HEENT: Atraumatic and Normocephalic, pupils  equally reactive to light and accomodation Neck: Supple, no JVD. No cervical lymphadenopathy.  Chest: Good air entry bilaterally, no added sounds  CVS: S1 S2 regular, no murmurs.  Abdomen: Bowel sounds present, Non tender and not distended with no gaurding, rigidity or rebound. Extremities: B/L Lower Ext shows no edema, both legs are warm to touch Neurology: Awake alert, and oriented X 3, CN II-XII intact, Non focal Skin: No Rash  Data Review Lab Results  Component Value Date   HGBA1C 6.0 (H) 11/14/2022   HGBA1C 5.1 07/08/2018    Assessment & Plan  Rachael Calhoun was seen today for gynecologic exam, hypertension and hyperlipidemia.  Diagnoses and all orders for this  visit:  COPD with chronic bronchitis -     budesonide-formoterol (SYMBICORT) 160-4.5 MCG/ACT inhaler; Inhale 2 puffs into the lungs 2 (two) times daily.  Essential hypertension BP goal - < 130/80 Explained that having normal blood pressure is the goal and medications are helping to get to goal and maintain normal blood pressure. DIET: Limit salt intake, read nutrition labels to check salt content, limit fried and high fatty foods  Avoid using multisymptom OTC cold preparations that generally contain sudafed which can rise BP. Consult with pharmacist on best cold relief products to use for persons with HTN EXERCISE Discussed incorporating exercise such as walking - 30 minutes most days of the week and can do in 10 minute intervals    -     amLODipine (NORVASC) 10 MG tablet; Take 1 tablet (10 mg total) by mouth daily.      Patient have been counseled extensively about nutrition and exercise. Other issues discussed during this visit include: low cholesterol diet, weight control and daily exercise, foot care, annual eye examinations at Ophthalmology, importance of adherence with medications and regular follow-up. We also discussed long term complications of uncontrolled diabetes and hypertension.    The patient was given clear instructions to go to ER or return to medical center if symptoms don't improve, worsen or new problems develop. The patient verbalized understanding. The patient was told to call to get lab results if they haven't heard anything in the next week.   This note has been created with Education officer, environmental. Any transcriptional errors are unintentional.   Grayce Sessions, NP 01/08/2023, 9:50 AM

## 2023-01-08 NOTE — Patient Instructions (Signed)
Please call to schedule colonoscopy   laced in [facility] [address] [place], [place] [address] PH# [phone]

## 2023-01-15 ENCOUNTER — Other Ambulatory Visit: Payer: Self-pay

## 2023-01-21 ENCOUNTER — Other Ambulatory Visit: Payer: Self-pay

## 2023-01-24 DIAGNOSIS — Z419 Encounter for procedure for purposes other than remedying health state, unspecified: Secondary | ICD-10-CM | POA: Diagnosis not present

## 2023-01-25 DIAGNOSIS — K0500 Acute gingivitis, plaque induced: Secondary | ICD-10-CM | POA: Diagnosis not present

## 2023-01-25 DIAGNOSIS — K122 Cellulitis and abscess of mouth: Secondary | ICD-10-CM | POA: Diagnosis not present

## 2023-01-25 DIAGNOSIS — K0381 Cracked tooth: Secondary | ICD-10-CM | POA: Diagnosis not present

## 2023-01-29 ENCOUNTER — Ambulatory Visit (INDEPENDENT_AMBULATORY_CARE_PROVIDER_SITE_OTHER): Payer: Medicaid Other | Admitting: Primary Care

## 2023-01-29 ENCOUNTER — Other Ambulatory Visit: Payer: Self-pay

## 2023-01-29 ENCOUNTER — Other Ambulatory Visit (INDEPENDENT_AMBULATORY_CARE_PROVIDER_SITE_OTHER): Payer: Self-pay | Admitting: Primary Care

## 2023-01-29 NOTE — Telephone Encounter (Signed)
Will forward to provider  

## 2023-01-31 MED ORDER — ALBUTEROL SULFATE HFA 108 (90 BASE) MCG/ACT IN AERS
2.0000 | INHALATION_SPRAY | Freq: Four times a day (QID) | RESPIRATORY_TRACT | 0 refills | Status: DC | PRN
  Filled 2023-01-31 – 2023-03-26 (×2): qty 18, 25d supply, fill #0

## 2023-02-02 ENCOUNTER — Other Ambulatory Visit: Payer: Self-pay

## 2023-02-05 ENCOUNTER — Ambulatory Visit (INDEPENDENT_AMBULATORY_CARE_PROVIDER_SITE_OTHER): Payer: Medicaid Other | Admitting: Primary Care

## 2023-02-09 ENCOUNTER — Other Ambulatory Visit: Payer: Self-pay

## 2023-02-13 ENCOUNTER — Ambulatory Visit (INDEPENDENT_AMBULATORY_CARE_PROVIDER_SITE_OTHER): Payer: Medicaid Other | Admitting: Primary Care

## 2023-02-23 DIAGNOSIS — Z419 Encounter for procedure for purposes other than remedying health state, unspecified: Secondary | ICD-10-CM | POA: Diagnosis not present

## 2023-03-11 ENCOUNTER — Telehealth: Payer: Self-pay

## 2023-03-11 NOTE — Telephone Encounter (Signed)
Chart review completed for patient. Patient is due for cervical cancer screen. I spoke to Daron and she would like to set up her pap smear. I reached out to [place] at the office to get in touch with her to schedule this. Elijio [facility] Specialist.

## 2023-03-26 ENCOUNTER — Other Ambulatory Visit: Payer: Self-pay

## 2023-03-26 DIAGNOSIS — Z419 Encounter for procedure for purposes other than remedying health state, unspecified: Secondary | ICD-10-CM | POA: Diagnosis not present

## 2023-03-27 ENCOUNTER — Other Ambulatory Visit: Payer: Self-pay

## 2023-04-26 DIAGNOSIS — Z419 Encounter for procedure for purposes other than remedying health state, unspecified: Secondary | ICD-10-CM | POA: Diagnosis not present

## 2023-04-30 ENCOUNTER — Other Ambulatory Visit: Payer: Self-pay

## 2023-04-30 ENCOUNTER — Other Ambulatory Visit (INDEPENDENT_AMBULATORY_CARE_PROVIDER_SITE_OTHER): Payer: Self-pay | Admitting: Primary Care

## 2023-05-01 ENCOUNTER — Other Ambulatory Visit: Payer: Self-pay

## 2023-05-01 MED ORDER — ALBUTEROL SULFATE HFA 108 (90 BASE) MCG/ACT IN AERS
2.0000 | INHALATION_SPRAY | Freq: Four times a day (QID) | RESPIRATORY_TRACT | 0 refills | Status: DC | PRN
  Filled 2023-05-01: qty 18, 25d supply, fill #0

## 2023-05-26 DIAGNOSIS — Z419 Encounter for procedure for purposes other than remedying health state, unspecified: Secondary | ICD-10-CM | POA: Diagnosis not present

## 2023-06-03 ENCOUNTER — Other Ambulatory Visit: Payer: Self-pay

## 2023-06-03 ENCOUNTER — Other Ambulatory Visit (INDEPENDENT_AMBULATORY_CARE_PROVIDER_SITE_OTHER): Payer: Self-pay | Admitting: Primary Care

## 2023-06-03 DIAGNOSIS — B372 Candidiasis of skin and nail: Secondary | ICD-10-CM

## 2023-06-04 NOTE — Telephone Encounter (Signed)
Requested medication (s) are due for refill today: yes   Requested medication (s) are on the active medication list: yes   Last refill:  nystatin- 11/14/22 #15 g 0 refills , albuterol - 05/01/23 #18 g 0 refills  Future visit scheduled: no   Notes to clinic:  no refills remain. Medication not assigned to a protocol. Do you want to refill Rxs?     Requested Prescriptions  Pending Prescriptions Disp Refills   nystatin (MYCOSTATIN/NYSTOP) powder 15 g 0    Sig: Apply 1 Application topically 3 (three) times daily.     Off-Protocol Failed - 06/03/2023  4:01 PM      Failed - Medication not assigned to a protocol, review manually.      Passed - Valid encounter within last 12 months    Recent Outpatient Visits           4 months ago COPD with chronic bronchitis   [place] [facility] Grayce Sessions, NP   6 months ago Encounter to establish care   [place] [facility] Grayce Sessions, NP   3 years ago Essential hypertension   [facility] [place], Shea Stakes, NP   3 years ago Essential hypertension   [place] [facility], Shea Stakes, NP   4 years ago Essential hypertension   [place] [facility], Shea Stakes, NP               albuterol (VENTOLIN HFA) 108 (90 Base) MCG/ACT inhaler 18 g 0    Sig: Inhale 2 puffs into the lungs every 6 (six) hours as needed for wheezing or shortness of breath.     Pulmonology:  Beta Agonists 2 Failed - 06/03/2023  4:01 PM      Failed - Last BP in normal range    BP Readings from Last 1 Encounters:  01/08/23 (!) 159/109         Passed - Last Heart Rate in normal range    Pulse Readings from Last 1 Encounters:  01/08/23 99         Passed - Valid encounter within last 12 months    Recent Outpatient Visits           4 months ago COPD with chronic bronchitis   [place] [facility] Grayce Sessions,  NP   6 months ago Encounter to establish care   [place] [facility] Grayce Sessions, NP   3 years ago Essential hypertension   [employer] [facility], Shea Stakes, NP   3 years ago Essential hypertension   [place] [facility], Shea Stakes, NP   4 years ago Essential hypertension   [facility], Shea Stakes, NP

## 2023-06-05 ENCOUNTER — Other Ambulatory Visit: Payer: Self-pay

## 2023-06-05 MED ORDER — NYSTATIN 100000 UNIT/GM EX POWD
1.0000 | Freq: Three times a day (TID) | CUTANEOUS | 0 refills | Status: AC
  Filled 2023-06-05: qty 15, 5d supply, fill #0

## 2023-06-05 MED ORDER — ALBUTEROL SULFATE HFA 108 (90 BASE) MCG/ACT IN AERS
2.0000 | INHALATION_SPRAY | Freq: Four times a day (QID) | RESPIRATORY_TRACT | 0 refills | Status: AC | PRN
  Filled 2023-06-05 – 2023-11-03 (×2): qty 18, 25d supply, fill #0

## 2023-06-12 DIAGNOSIS — M545 Low back pain, unspecified: Secondary | ICD-10-CM | POA: Diagnosis not present

## 2023-06-12 DIAGNOSIS — M5432 Sciatica, left side: Secondary | ICD-10-CM | POA: Diagnosis not present

## 2023-06-12 DIAGNOSIS — J45901 Unspecified asthma with (acute) exacerbation: Secondary | ICD-10-CM | POA: Diagnosis not present

## 2023-06-12 DIAGNOSIS — R062 Wheezing: Secondary | ICD-10-CM | POA: Diagnosis not present

## 2023-06-12 DIAGNOSIS — I10 Essential (primary) hypertension: Secondary | ICD-10-CM | POA: Diagnosis not present

## 2023-06-16 ENCOUNTER — Other Ambulatory Visit: Payer: Self-pay

## 2023-06-26 DIAGNOSIS — Z419 Encounter for procedure for purposes other than remedying health state, unspecified: Secondary | ICD-10-CM | POA: Diagnosis not present

## 2023-07-26 DIAGNOSIS — Z419 Encounter for procedure for purposes other than remedying health state, unspecified: Secondary | ICD-10-CM | POA: Diagnosis not present

## 2023-08-26 DIAGNOSIS — Z419 Encounter for procedure for purposes other than remedying health state, unspecified: Secondary | ICD-10-CM | POA: Diagnosis not present

## 2023-09-26 DIAGNOSIS — Z419 Encounter for procedure for purposes other than remedying health state, unspecified: Secondary | ICD-10-CM | POA: Diagnosis not present

## 2023-10-24 DIAGNOSIS — Z419 Encounter for procedure for purposes other than remedying health state, unspecified: Secondary | ICD-10-CM | POA: Diagnosis not present

## 2023-11-03 ENCOUNTER — Other Ambulatory Visit: Payer: Self-pay

## 2023-11-04 ENCOUNTER — Other Ambulatory Visit: Payer: Self-pay

## 2023-12-05 DIAGNOSIS — Z419 Encounter for procedure for purposes other than remedying health state, unspecified: Secondary | ICD-10-CM | POA: Diagnosis not present

## 2023-12-07 ENCOUNTER — Other Ambulatory Visit (INDEPENDENT_AMBULATORY_CARE_PROVIDER_SITE_OTHER): Payer: Self-pay | Admitting: Primary Care

## 2023-12-07 ENCOUNTER — Other Ambulatory Visit: Payer: Self-pay

## 2023-12-08 ENCOUNTER — Other Ambulatory Visit: Payer: Self-pay

## 2024-01-04 DIAGNOSIS — Z419 Encounter for procedure for purposes other than remedying health state, unspecified: Secondary | ICD-10-CM | POA: Diagnosis not present

## 2024-01-20 ENCOUNTER — Other Ambulatory Visit: Payer: Self-pay

## 2024-02-04 DIAGNOSIS — Z419 Encounter for procedure for purposes other than remedying health state, unspecified: Secondary | ICD-10-CM | POA: Diagnosis not present

## 2024-03-05 DIAGNOSIS — Z419 Encounter for procedure for purposes other than remedying health state, unspecified: Secondary | ICD-10-CM | POA: Diagnosis not present

## 2024-04-05 DIAGNOSIS — Z419 Encounter for procedure for purposes other than remedying health state, unspecified: Secondary | ICD-10-CM | POA: Diagnosis not present

## 2024-05-06 DIAGNOSIS — Z419 Encounter for procedure for purposes other than remedying health state, unspecified: Secondary | ICD-10-CM | POA: Diagnosis not present

## 2024-05-30 DIAGNOSIS — M545 Low back pain, unspecified: Secondary | ICD-10-CM | POA: Diagnosis not present

## 2024-05-30 DIAGNOSIS — J441 Chronic obstructive pulmonary disease with (acute) exacerbation: Secondary | ICD-10-CM | POA: Diagnosis not present

## 2024-05-30 DIAGNOSIS — R21 Rash and other nonspecific skin eruption: Secondary | ICD-10-CM | POA: Diagnosis not present

## 2024-05-30 DIAGNOSIS — A09 Infectious gastroenteritis and colitis, unspecified: Secondary | ICD-10-CM | POA: Diagnosis not present

## 2024-05-30 DIAGNOSIS — R110 Nausea: Secondary | ICD-10-CM | POA: Diagnosis not present

## 2024-05-30 DIAGNOSIS — R197 Diarrhea, unspecified: Secondary | ICD-10-CM | POA: Diagnosis not present

## 2024-05-30 DIAGNOSIS — I10 Essential (primary) hypertension: Secondary | ICD-10-CM | POA: Diagnosis not present

## 2024-06-30 ENCOUNTER — Other Ambulatory Visit: Payer: Self-pay

## 2024-09-22 ENCOUNTER — Other Ambulatory Visit (INDEPENDENT_AMBULATORY_CARE_PROVIDER_SITE_OTHER): Payer: Self-pay | Admitting: Primary Care

## 2024-09-22 ENCOUNTER — Other Ambulatory Visit: Payer: Self-pay

## 2024-09-22 DIAGNOSIS — I10 Essential (primary) hypertension: Secondary | ICD-10-CM

## 2024-09-26 ENCOUNTER — Other Ambulatory Visit: Payer: Self-pay
